# Patient Record
Sex: Female | Born: 1960 | Race: White | Hispanic: No | Marital: Married | Smoking: Former smoker
Health system: Southern US, Community
[De-identification: ages and names within clinical notes are randomized; demographics above are authoritative.]

## PROBLEM LIST (undated history)

## (undated) DIAGNOSIS — Z9981 Dependence on supplemental oxygen: Secondary | ICD-10-CM

## (undated) DIAGNOSIS — I509 Heart failure, unspecified: Secondary | ICD-10-CM

## (undated) DIAGNOSIS — E785 Hyperlipidemia, unspecified: Secondary | ICD-10-CM

## (undated) DIAGNOSIS — F32A Depression, unspecified: Secondary | ICD-10-CM

## (undated) DIAGNOSIS — I700 Atherosclerosis of aorta: Secondary | ICD-10-CM

## (undated) DIAGNOSIS — N200 Calculus of kidney: Secondary | ICD-10-CM

## (undated) DIAGNOSIS — I10 Essential (primary) hypertension: Secondary | ICD-10-CM

## (undated) DIAGNOSIS — J189 Pneumonia, unspecified organism: Secondary | ICD-10-CM

## (undated) DIAGNOSIS — J449 Chronic obstructive pulmonary disease, unspecified: Secondary | ICD-10-CM

## (undated) DIAGNOSIS — Z87442 Personal history of urinary calculi: Secondary | ICD-10-CM

## (undated) DIAGNOSIS — E119 Type 2 diabetes mellitus without complications: Secondary | ICD-10-CM

## (undated) HISTORY — DX: Calculus of kidney: N20.0

## (undated) HISTORY — DX: Heart failure, unspecified: I50.9

## (undated) HISTORY — DX: Chronic obstructive pulmonary disease, unspecified: J44.9

## (undated) HISTORY — DX: Hyperlipidemia, unspecified: E78.5

## (undated) HISTORY — DX: Atherosclerosis of aorta: I70.0

## (undated) HISTORY — PX: CARDIAC CATHETERIZATION: SHX172

## (undated) HISTORY — DX: Essential (primary) hypertension: I10

## (undated) HISTORY — PX: CYSTOSCOPY W/ URETERAL STENT PLACEMENT: SHX1429

## (undated) HISTORY — DX: Dependence on supplemental oxygen: Z99.81

---

## 2005-01-03 HISTORY — PX: OTHER SURGICAL HISTORY: SHX169

## 2015-01-04 DIAGNOSIS — T8859XA Other complications of anesthesia, initial encounter: Secondary | ICD-10-CM

## 2015-01-04 HISTORY — DX: Other complications of anesthesia, initial encounter: T88.59XA

## 2021-02-23 ENCOUNTER — Other Ambulatory Visit: Payer: Self-pay | Admitting: Family Medicine

## 2021-02-23 DIAGNOSIS — F17200 Nicotine dependence, unspecified, uncomplicated: Secondary | ICD-10-CM

## 2021-03-09 ENCOUNTER — Emergency Department (HOSPITAL_COMMUNITY): Payer: BC Managed Care – PPO

## 2021-03-09 ENCOUNTER — Encounter (HOSPITAL_COMMUNITY): Payer: Self-pay | Admitting: Emergency Medicine

## 2021-03-09 ENCOUNTER — Emergency Department (HOSPITAL_COMMUNITY)
Admission: EM | Admit: 2021-03-09 | Discharge: 2021-03-09 | Disposition: A | Discharge status: Home / Self Care | Payer: BC Managed Care – PPO | Attending: Emergency Medicine | Admitting: Emergency Medicine

## 2021-03-09 ENCOUNTER — Other Ambulatory Visit: Payer: Self-pay

## 2021-03-09 DIAGNOSIS — R109 Unspecified abdominal pain: Secondary | ICD-10-CM | POA: Diagnosis present

## 2021-03-09 DIAGNOSIS — N201 Calculus of ureter: Secondary | ICD-10-CM | POA: Diagnosis not present

## 2021-03-09 DIAGNOSIS — N9489 Other specified conditions associated with female genital organs and menstrual cycle: Secondary | ICD-10-CM | POA: Insufficient documentation

## 2021-03-09 DIAGNOSIS — N23 Unspecified renal colic: Secondary | ICD-10-CM | POA: Diagnosis not present

## 2021-03-09 LAB — CBC WITH DIFFERENTIAL/PLATELET
Abs Immature Granulocytes: 0.03 10*3/uL (ref 0.00–0.07)
Basophils Absolute: 0.1 10*3/uL (ref 0.0–0.1)
Basophils Relative: 1 %
Eosinophils Absolute: 0.3 10*3/uL (ref 0.0–0.5)
Eosinophils Relative: 2 %
HCT: 35.8 % — ABNORMAL LOW (ref 36.0–46.0)
Hemoglobin: 11.8 g/dL — ABNORMAL LOW (ref 12.0–15.0)
Immature Granulocytes: 0 %
Lymphocytes Relative: 20 %
Lymphs Abs: 2.4 10*3/uL (ref 0.7–4.0)
MCH: 26.9 pg (ref 26.0–34.0)
MCHC: 33 g/dL (ref 30.0–36.0)
MCV: 81.7 fL (ref 80.0–100.0)
Monocytes Absolute: 0.8 10*3/uL (ref 0.1–1.0)
Monocytes Relative: 7 %
Neutro Abs: 8.4 10*3/uL — ABNORMAL HIGH (ref 1.7–7.7)
Neutrophils Relative %: 70 %
Platelets: 308 10*3/uL (ref 150–400)
RBC: 4.38 MIL/uL (ref 3.87–5.11)
RDW: 13.6 % (ref 11.5–15.5)
WBC: 12 10*3/uL — ABNORMAL HIGH (ref 4.0–10.5)
nRBC: 0 % (ref 0.0–0.2)

## 2021-03-09 LAB — BASIC METABOLIC PANEL
Anion gap: 13 (ref 5–15)
BUN: 15 mg/dL (ref 6–20)
CO2: 25 mmol/L (ref 22–32)
Calcium: 9.4 mg/dL (ref 8.9–10.3)
Chloride: 101 mmol/L (ref 98–111)
Creatinine, Ser: 1.24 mg/dL — ABNORMAL HIGH (ref 0.44–1.00)
GFR, Estimated: 50 mL/min — ABNORMAL LOW (ref >60)
Glucose, Bld: 122 mg/dL — ABNORMAL HIGH (ref 70–99)
Potassium: 3.5 mmol/L (ref 3.5–5.1)
Sodium: 139 mmol/L (ref 135–145)

## 2021-03-09 LAB — URINALYSIS, ROUTINE W REFLEX MICROSCOPIC
Bilirubin Urine: NEGATIVE
Glucose, UA: NEGATIVE mg/dL
Hgb urine dipstick: NEGATIVE
Ketones, ur: NEGATIVE mg/dL
Nitrite: NEGATIVE
Protein, ur: NEGATIVE mg/dL
Specific Gravity, Urine: 1.013 (ref 1.005–1.030)
pH: 7 (ref 5.0–8.0)

## 2021-03-09 LAB — I-STAT BETA HCG BLOOD, ED (MC, WL, AP ONLY): I-stat hCG, quantitative: 10.5 m[IU]/mL — ABNORMAL HIGH (ref <5)

## 2021-03-09 MED ORDER — HYDROCODONE-ACETAMINOPHEN 5-325 MG PO TABS: 1.0000 | ORAL_TABLET | Freq: Four times a day (QID) | ORAL | 0 refills | Status: DC | PRN

## 2021-03-09 MED ORDER — KETOROLAC TROMETHAMINE 30 MG/ML IJ SOLN
30.0000 mg | Freq: Once | INTRAMUSCULAR | Status: AC
  Administered 2021-03-09: 30 mg via INTRAVENOUS
  Filled 2021-03-09: qty 1

## 2021-03-09 MED ORDER — TAMSULOSIN HCL 0.4 MG PO CAPS: 0.4000 mg | ORAL_CAPSULE | Freq: Two times a day (BID) | ORAL | 0 refills | Status: DC

## 2021-03-09 MED ORDER — ONDANSETRON 4 MG PO TBDP
4.0000 mg | ORAL_TABLET | Freq: Once | ORAL | Status: AC
  Administered 2021-03-09: 4 mg via ORAL
  Filled 2021-03-09: qty 1

## 2021-03-09 MED ORDER — OXYCODONE-ACETAMINOPHEN 5-325 MG PO TABS
1.0000 | ORAL_TABLET | Freq: Once | ORAL | Status: AC
  Administered 2021-03-09: 1 via ORAL
  Filled 2021-03-09: qty 1

## 2021-03-09 MED ORDER — ONDANSETRON HCL 4 MG/2ML IJ SOLN
4.0000 mg | Freq: Once | INTRAMUSCULAR | Status: AC
  Administered 2021-03-09: 4 mg via INTRAVENOUS
  Filled 2021-03-09: qty 2

## 2021-03-09 MED ORDER — ONDANSETRON HCL 4 MG PO TABS: 4.0000 mg | ORAL_TABLET | Freq: Three times a day (TID) | ORAL | 0 refills | Status: DC | PRN

## 2021-03-09 NOTE — ED Triage Notes (Signed)
Patient reports left flank pain onset 10 pm last night , no hematuria , denies emesis or fever .  ?

## 2021-03-09 NOTE — Discharge Instructions (Signed)
Go To [facility] today at 12:30pm ?Return to the ED immediately if you develop fever, uncontrolled pain or vomiting, or other concerns. ? ?

## 2021-03-09 NOTE — ED Provider Notes (Signed)
[facility] EMERGENCY DEPARTMENT Provider Note   CSN: [id number] Arrival date & time: 03/09/21  0425     History  Chief Complaint  Patient presents with   Flank Pain    Hx; Kidney Stones    Mckenzie Sullivan is a 61 y/o F with a hx of kidney stones s/p lithotripsy and stent placement who presents to the ED for L flank pain radiating into the LLQ since 2230 yesterday evening. Pt reports she's had chronic back pain for years but recognized last night that her back pain felt different, stating it felt more similar to her waxing/waning sharp, stabbing-like pain that she's had with previous kidney stones. She recalls her last stone was ~2 years ago. She has felt nauseated, but no vomiting. No diarrhea, fever, chills, dysuria, hematuria, or urinary frequency/urgency. Pt states she had a large stone in 2017 that caused severe hydronephrosis requiring both stent placement and lithotripsy. She reports having chronic and unchanged SOB since this hospital stay requiring 2L home oxygen and has followed up with pulmonology and cardiology. Of note, pt recently moved here ~9-10 months ago from [place] and is still working on establishing care. She has established care with Lance Bosch, ANP. No other medical concerns reported.    Flank Pain      Home Medications Prior to Admission medications   Medication Sig Start Date End Date Taking? Authorizing Provider  HYDROcodone-acetaminophen (NORCO) 5-325 MG tablet Take 1-2 tablets by mouth every 6 (six) hours as needed. 03/09/21  Yes Nakai Pollio, PA-C  ondansetron (ZOFRAN) 4 MG tablet Take 1 tablet (4 mg total) by mouth every 8 (eight) hours as needed for nausea or vomiting. 03/09/21  Yes Arthor Captain, PA-C  tamsulosin (FLOMAX) 0.4 MG CAPS capsule Take 1 capsule (0.4 mg total) by mouth 2 (two) times daily. 03/09/21  Yes Arthor Captain, PA-C      Allergies    Patient has no known allergies.    Review of Systems   Review of Systems   Genitourinary:  Positive for flank pain.   Physical Exam Updated Vital Signs BP (!) 159/72    Pulse 62    Temp 97.9 F (36.6 C) (Oral)    Resp 20    SpO2 99%  Physical Exam Vitals and nursing note reviewed.  Constitutional:      General: She is not in acute distress.    Appearance: She is well-developed. She is not diaphoretic.  HENT:     Head: Normocephalic and atraumatic.     Right Ear: External ear normal.     Left Ear: External ear normal.     Nose: Nose normal.     Mouth/Throat:     Mouth: Mucous membranes are moist.  Eyes:     General: No scleral icterus.    Conjunctiva/sclera: Conjunctivae normal.  Cardiovascular:     Rate and Rhythm: Normal rate and regular rhythm.     Heart sounds: Normal heart sounds. No murmur heard.   No friction rub. No gallop.  Pulmonary:     Effort: Pulmonary effort is normal. No respiratory distress.     Breath sounds: Normal breath sounds.  Abdominal:     General: Bowel sounds are normal. There is no distension.     Palpations: Abdomen is soft. There is no mass.     Tenderness: There is no abdominal tenderness. There is no guarding.  Musculoskeletal:     Cervical back: Normal range of motion.  Skin:    General: Skin  is warm and dry.  Neurological:     Mental Status: She is alert and oriented to person, place, and time.  Psychiatric:        Behavior: Behavior normal.    ED Results / Procedures / Treatments   Labs (all labs ordered are listed, but only abnormal results are displayed) Labs Reviewed  BASIC METABOLIC PANEL - Abnormal; Notable for the following components:      Result Value   Glucose, Bld 122 (*)    Creatinine, Ser 1.24 (*)    GFR, Estimated 50 (*)    All other components within normal limits  CBC WITH DIFFERENTIAL/PLATELET - Abnormal; Notable for the following components:   WBC 12.0 (*)    Hemoglobin 11.8 (*)    HCT 35.8 (*)    Neutro Abs 8.4 (*)    All other components within normal limits  URINALYSIS, ROUTINE  W REFLEX MICROSCOPIC - Abnormal; Notable for the following components:   APPearance CLOUDY (*)    Leukocytes,Ua TRACE (*)    Bacteria, UA RARE (*)    All other components within normal limits  I-STAT BETA HCG BLOOD, ED ([facility], [facility], [facility] ONLY) - Abnormal; Notable for the following components:   I-stat hCG, quantitative 10.5 (*)    All other components within normal limits    EKG None  Radiology CT Renal Stone Study  Result Date: 03/09/2021 CLINICAL DATA:  61 year old female with history of flank pain. Suspected kidney stone. EXAM: CT ABDOMEN AND PELVIS WITHOUT CONTRAST TECHNIQUE: Multidetector CT imaging of the abdomen and pelvis was performed following the standard protocol without IV contrast. RADIATION DOSE REDUCTION: This exam was performed according to the departmental dose-optimization program which includes automated exposure control, adjustment of the mA and/or kV according to patient size and/or use of iterative reconstruction technique. COMPARISON:  No priors. FINDINGS: Lower chest: Bilateral breast implants are incidentally noted. Hepatobiliary: No definite suspicious cystic or solid hepatic lesions are confidently identified on today's noncontrast CT examination. Unenhanced appearance of the gallbladder is normal. Pancreas: No definite pancreatic mass or peripancreatic fluid collections or inflammatory changes are noted on today's noncontrast CT examination. Spleen: Unremarkable. Adrenals/Urinary Tract: In the proximal third of the left ureter there is an obstructive calculus measuring 5 x 6 x 7 mm, with severe proximal left hydroureteronephrosis and extensive left-sided perinephric stranding. No additional calculi are identified elsewhere along the course of either ureter, within the right renal collecting system, or in the lumen of the urinary bladder. No right hydroureteronephrosis. Unenhanced appearance of the right kidney and bilateral adrenal glands is otherwise normal. Unenhanced  appearance of the urinary bladder is normal. Stomach/Bowel: Unenhanced appearance of the stomach is normal. No pathologic dilatation of small bowel or colon. Numerous colonic diverticulae are noted in the sigmoid colon, without surrounding inflammatory changes to suggest an acute diverticulitis at this time. The appendix is not confidently identified and may be surgically absent. Regardless, there are no inflammatory changes noted adjacent to the cecum to suggest the presence of an acute appendicitis at this time. Vascular/Lymphatic: Aortic atherosclerosis. No lymphadenopathy noted in the abdomen or pelvis. Reproductive: Uterus and ovaries are unremarkable in appearance. Other: No significant volume of ascites.  No pneumoperitoneum. Musculoskeletal: There are no aggressive appearing lytic or blastic lesions noted in the visualized portions of the skeleton. IMPRESSION: 1. Obstructive 5 x 6 x 7 mm calculus in the proximal third of the left ureter with severe proximal left hydroureteronephrosis. 2. Aortic atherosclerosis. Electronically Signed   By: Reuel Boom  Entrikin M.D.   On: 03/09/2021 05:31    Procedures Procedures    Medications Ordered in ED Medications  ondansetron (ZOFRAN-ODT) disintegrating tablet 4 mg (4 mg Oral Given 03/09/21 0442)  oxyCODONE-acetaminophen (PERCOCET/ROXICET) 5-325 MG per tablet 1 tablet (1 tablet Oral Given 03/09/21 0441)  ketorolac (TORADOL) 30 MG/ML injection 30 mg (30 mg Intravenous Given 03/09/21 1002)  ondansetron (ZOFRAN) injection 4 mg (4 mg Intravenous Given 03/09/21 1001)    ED Course/ Medical Decision Making/ A&P Clinical Course as of 03/09/21 1046  Tue Mar 09, 2021  1028 I-stat hCG, quantitative(!): 10.5 False positive [AH]  1028 Basic metabolic panel(!) [AH]  1028 Creatinine(!): 1.24 Mildly elevated cr. No baseline [AH]  1028 CBC with Differential(!) [AH]  1028 WBC(!): 12.0 WBC up- likely APR [AH]  1029 Urinalysis, Routine w reflex microscopic(!) UA negative for  infection [AH]  1031 CT Renal Stone Study I personally visualized images of CT which shows UPJ STOne with marked hydro/ureteronephrosis  [AH]  1034 Case discussed with Dr.Bell- patient will see the patient in clinic at 12:45 AM this morning.  I called the office and secured the appointment for the patient.  Patient pain is significantly improved after Toradol and she feels comfortable with this plan. [AH]    Clinical Course User Index [AH] Arthor Captain, PA-C                           Medical Decision Making Patient here with flank pain. The differential diagnosis of emergent flank pain includes, but is not limited to :Abdominal aortic aneurysm,, Renal artery embolism,Renal vein thrombosis, Aortic dissection, Mesenteric ischemia, Pyelonephritis, Renal infarction, Renal hemorrhage, Nephrolithiasis/ Renal Colic, Bladder tumor,Cystitis, Biliary colic, Pancreatitis Perforated peptic ulcer Appendicitis ,Inguinal Hernia, Diverticulitis, Bowel obstruction Ectopic Pregnancy,PID/TOA,Ovarian cyst, Ovarian torsion Shingles Lower lobe pneumonia, Retroperitoneal hematoma/abscess/tumor, Epidural abscess, Epidural hematoma. Patient has an extensive history of recurrent kidney stones.  She appears to have a large proximal stone that will likely require intervention.  I considered hospitalization however patient's pain is well controlled.  Her labs are not significantly abnormal.  She does not appear to have infected stone.  Case discussed with Dr. Alvester Morin who will see her in the office today at 1245.  Patient is comfortable with this plan.  I reviewed the PDMP and have prescribed narcotic pain medications, Flomax and antinausea medication.  The patient's pain is significantly improved after giving Toradol here in the emergency department.  She appears appropriate for discharge with extremely close outpatient follow-up today.    Problems Addressed: Ureteral colic: acute illness or injury with systemic  symptoms Ureterolithiasis: acute illness or injury with systemic symptoms  Risk Prescription drug management.    Final Clinical Impression(s) / ED Diagnoses Final diagnoses:  Ureterolithiasis  Ureteral colic    Rx / DC Orders ED Discharge Orders          Ordered    HYDROcodone-acetaminophen (NORCO) 5-325 MG tablet  Every 6 hours PRN        03/09/21 1025    ondansetron (ZOFRAN) 4 MG tablet  Every 8 hours PRN        03/09/21 1025    tamsulosin (FLOMAX) 0.4 MG CAPS capsule  2 times daily        03/09/21 1025              Arthor Captain, PA-C 03/09/21 1047    Jacalyn Lefevre, MD 03/09/21 1229

## 2021-03-09 NOTE — ED Provider Triage Note (Signed)
Emergency Medicine Provider Triage Evaluation Note ? ?Mckenzie Sullivan , a 61 y.o. female  was evaluated in triage.  Pt complains of gradual onset, constant, sharp, left flank pain with radiation into left lower quadrant that began around 10 PM last night.  Patient also complains of nausea.  She reports history of frequent kidney stones and states this feels similar.  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  She has not taken anything specifically for pain.  Denies any fevers or chills. ? ?Review of Systems  ?Positive: + flank pain, nausea ?Negative: - vomiting, fevers, urinary symptoms ? ?Physical Exam  ?BP (!) 158/90 (BP Location: Left Arm)   Pulse 72   Temp 98 ?F (36.7 ?C) (Oral)   Resp 17   SpO2 100%  ?Gen:   Awake, no distress   ?Resp:  Normal effort ?MSK:   Moves extremities without difficulty  ?Other:  + left CVA TTP ? ?Medical Decision Making  ?Medically screening exam initiated at 4:36 AM.  Appropriate orders placed.  Rya Rausch was informed that the remainder of the evaluation will be completed by another provider, this initial triage assessment does not replace that evaluation, and the importance of remaining in the ED until their evaluation is complete. ? ? ?  ?Tanda Rockers, PA-C ?03/09/21 6812 ? ?

## 2021-03-15 ENCOUNTER — Encounter: Payer: Self-pay | Admitting: Cardiology

## 2021-03-15 ENCOUNTER — Ambulatory Visit: Payer: BC Managed Care – PPO | Admitting: Cardiology

## 2021-03-15 ENCOUNTER — Ambulatory Visit: Payer: BC Managed Care – PPO

## 2021-03-15 ENCOUNTER — Other Ambulatory Visit: Payer: Self-pay

## 2021-03-15 VITALS — BP 168/77 | HR 84 | Temp 98.9°F | Resp 16 | Ht 62.0 in | Wt 194.0 lb

## 2021-03-15 DIAGNOSIS — Z01818 Encounter for other preprocedural examination: Secondary | ICD-10-CM

## 2021-03-15 DIAGNOSIS — E785 Hyperlipidemia, unspecified: Secondary | ICD-10-CM

## 2021-03-15 DIAGNOSIS — I700 Atherosclerosis of aorta: Secondary | ICD-10-CM

## 2021-03-15 DIAGNOSIS — Z9981 Dependence on supplemental oxygen: Secondary | ICD-10-CM

## 2021-03-15 DIAGNOSIS — R030 Elevated blood-pressure reading, without diagnosis of hypertension: Secondary | ICD-10-CM

## 2021-03-15 DIAGNOSIS — Z87891 Personal history of nicotine dependence: Secondary | ICD-10-CM

## 2021-03-15 DIAGNOSIS — J449 Chronic obstructive pulmonary disease, unspecified: Secondary | ICD-10-CM

## 2021-03-15 MED ORDER — AMLODIPINE BESYLATE 5 MG PO TABS: 5.0000 mg | ORAL_TABLET | Freq: Every morning | ORAL | 0 refills | Status: DC

## 2021-03-15 NOTE — Progress Notes (Signed)
Date:  03/15/2021   ID:  [name], DOB 1960/09/03, MRN [id number]  PCP:  Lance Bosch, NP  Cardiologist:  Tessa Lerner, DO, [facility] (established care 03/15/2021) Former Cardiology Providers: Karleen Hampshire, PA; Dr. Laverta Baltimore (EP).   REASON FOR CONSULT: Preoperative risk stratification  REQUESTING PHYSICIAN:  Lance Bosch, NP [address] [place],  [place] [address]  Chief Complaint  Patient presents with   Pre-op Exam   Congestive Heart Failure    HPI  Mckenzie Sullivan is a 61 y.o. Caucasian female who presents to the office with a chief complaint of " preop clearance." Patient's past medical history and cardiovascular risk factors include: Chronic HFpEF, dyslipidemia, diabetes mellitus type 2 (diet controlled), COPD requiring supplemental oxygen, former smoker, atherosclerosis of the abdominal aorta.   She is referred to the office at the request of Lance Bosch, NP for evaluation of preoperative risk stratification.  Patient has history of recurrent nephrolithiasis starting 2017.  Recently started having left-sided flank pain which is usually a sign of kidney stones and therefore went to the [facility], ED on March 09, 2021 and was diagnosed with nephrolithiasis along with hydronephrosis.  She has followed up with urology and plans are to undergo possible lithotripsy with ureteral stent placement.  The date of the procedure is still to be determined.  She is referred to cardiology for preoperative risk stratification for this upcoming noncardiac procedure - recommendations regarding  holding aspirin prior to the elective procedure.  Actively denies any chest pain at rest or with effort related activities.  Shortness of breath is chronic and stable.  At rest she requires 2 L of nasal cannula oxygen given her COPD and with exertion she requires 3 L.  She denies orthopnea, paroxysmal nocturnal dyspnea or lower extremity swelling.  Functional status is limited due to her  requiring nasal cannula oxygen.  But is still able to do her activities of daily living without any discomfort.  No significant obstructive CAD based on her last heart catheterization.  She carries a history of congestive heart failure.  Last hospitalization 2017.  Currently euvolemic. Not on GDMT for reason unknown.   Given her extensive pulmonary comorbidities patient is in the process of getting reestablished with a local pulmonologist.  She has a CT scan scheduled next week and pulmonary appointment the following week.  ALLERGIES: No Known Allergies  MEDICATION LIST PRIOR TO VISIT: Current Meds  Medication Sig   albuterol (VENTOLIN HFA) 108 (90 Base) MCG/ACT inhaler Inhale 1-2 puffs into the lungs.   amLODipine (NORVASC) 5 MG tablet Take 1 tablet (5 mg total) by mouth every morning.   atorvastatin (LIPITOR) 40 MG tablet Take 40 mg by mouth daily.   clobetasol ointment (TEMOVATE) 0.05 % Apply to hands and foots bid for 2 weeks then qd for 2 weeks then qod for 2 weeks Avoid face underarms and groin   ezetimibe (ZETIA) 10 MG tablet Take 10 mg by mouth daily.   fluticasone (FLONASE) 50 MCG/ACT nasal spray Place into the nose.   HYDROcodone-acetaminophen (NORCO) 5-325 MG tablet Take 1-2 tablets by mouth every 6 (six) hours as needed.   hydrOXYzine (ATARAX) 10 MG tablet Take 1-2 tablets by mouth at bedtime.   mupirocin ointment (BACTROBAN) 2 % SMARTSIG:1 Application Topical 2-3 Times Daily   ondansetron (ZOFRAN) 4 MG tablet Take 1 tablet (4 mg total) by mouth every 8 (eight) hours as needed for nausea or vomiting.   OTEZLA 30 MG TABS Take 1 tablet by  mouth 2 (two) times daily.   tamsulosin (FLOMAX) 0.4 MG CAPS capsule Take 1 capsule (0.4 mg total) by mouth 2 (two) times daily.   TRELEGY ELLIPTA 200-62.5-25 MCG/ACT AEPB Take 1 puff by mouth daily.   venlafaxine XR (EFFEXOR-XR) 150 MG 24 hr capsule Take 150 mg by mouth daily.     PAST MEDICAL HISTORY: Past Medical History:  Diagnosis Date    Abdominal aortic atherosclerosis (HCC)    CHF (congestive heart failure) (HCC)    COPD (chronic obstructive pulmonary disease) (HCC)    Dyslipidemia    Hypertension    Kidney stones    Oxygen dependent     PAST SURGICAL HISTORY: Past Surgical History:  Procedure Laterality Date   CESAREAN SECTION     TONSILLECTOMY      FAMILY HISTORY: The patient family history includes Pneumonia in her mother.  SOCIAL HISTORY:  The patient  reports that she quit smoking about 13 years ago. Her smoking use included cigarettes. She has never used smokeless tobacco. She reports that she does not currently use drugs after having used the following drugs: Marijuana.  REVIEW OF SYSTEMS: Review of Systems  Cardiovascular:  Positive for dyspnea on exertion (chronic and stable.). Negative for chest pain, claudication, leg swelling, near-syncope, orthopnea, palpitations, paroxysmal nocturnal dyspnea and syncope.  Respiratory:  Positive for shortness of breath (chronic and stable.).    PHYSICAL EXAM: Vitals with BMI 03/15/2021 03/15/2021 03/09/2021  Height - 5\' 2"  -  Weight - 194 lbs -  BMI - 35.47 -  Systolic 168 171  Diastolic 77 74 72  Pulse 84 87 62    CONSTITUTIONAL: Well-developed and well-nourished. No acute distress.  Nasal cannula oxygen. SKIN: Skin is warm and dry. No rash noted. No cyanosis. No pallor. No jaundice HEAD: Normocephalic and atraumatic.  EYES: No scleral icterus MOUTH/THROAT: Moist oral membranes.  NECK: No JVD present. No thyromegaly noted. No carotid bruits  LYMPHATIC: No visible cervical adenopathy.  CHEST Normal respiratory effort. No intercostal retractions  LUNGS: Clear to auscultation bilaterally upper lung fields with decreased breath sounds at the bases.  No stridor. No wheezes. No rales.  CARDIOVASCULAR: Regular rate and rhythm, positive S1-S2, no murmurs rubs or gallops appreciated. ABDOMINAL: Soft, nontender, nondistended, positive bowel sounds in all 4  quadrants no apparent ascites.  EXTREMITIES: No peripheral edema, warm to touch, 2+ bilateral PT pulses. HEMATOLOGIC: No significant bruising NEUROLOGIC: Oriented to person, place, and time. Nonfocal. Normal muscle tone.  PSYCHIATRIC: Normal mood and affect. Normal behavior. Cooperative  CARDIAC DATABASE: EKG: 03/15/2021: NSR, 74 bpm, normal axis, negative precordial T waves.   Echocardiogram: 04/03/2019 (@ [facility]): LVEF 60%. No significant valve disease.  Stress Testing: No results found for this or any previous visit from the past 1095 days.   Heart Catheterization: Cardiac catheterization 10/06/2015 (@ [facility]); RA 16, RV 48/10, PA 48/21, PAOP 20, CO 4.86, CI 5.4, LVEDP 23 mmHg. LVEF 60%. Mild nonobstructive CAD; 30% narrowing of the origin of the first diagonal branch.   LABORATORY DATA: CBC Latest Ref Rng & Units 03/09/2021  WBC 4.0 - 10.5 K/uL 12.0(H)  Hemoglobin 12.0 - 15.0 g/dL 11.8(L)  Hematocrit 36.0 - 46.0 % 35.8(L)  Platelets 150 - 400 K/uL 308    CMP Latest Ref Rng & Units 03/09/2021  Glucose 70 - 99 mg/dL 05/09/2021)  BUN 6 - 20 mg/dL 15  Creatinine 297(L - 8.92 mg/dL 1.19)  Sodium 4.17(E - 081 mmol/L 139  Potassium 3.5 -  5.1 mmol/L 3.5  Chloride 98 - 111 mmol/L 101  CO2 22 - 32 mmol/L 25  Calcium 8.9 - 10.3 mg/dL 9.4    Lipid Panel  No results found for: CHOL, TRIG, HDL, CHOLHDL, VLDL, LDLCALC, LDLDIRECT, LABVLDL  No components found for: NTPROBNP No results for input(s): PROBNP in the last 8760 hours. No results for input(s): TSH in the last 8760 hours.  BMP Recent Labs    03/09/21 0447  NA 139  K 3.5  CL 101  CO2 25  GLUCOSE 122*  BUN 15  CREATININE 1.24*  CALCIUM 9.4  GFRNONAA 50*    HEMOGLOBIN A1C No results found for: HGBA1C, MPG  IMPRESSION:    ICD-10-CM   1. Pre-op evaluation  Z01.818 EKG 12-Lead    PCV ECHOCARDIOGRAM COMPLETE    2. Dyslipidemia  E78.5     3. Atherosclerosis of aorta (HCC)  I70.0     4.  Former smoker  Z87.891     5. Chronic obstructive pulmonary disease, unspecified COPD type (HCC)  J44.9     6. Oxygen dependent  Z99.81     7. Blood pressure elevated without history of HTN  R03.0 amLODipine (NORVASC) 5 MG tablet       RECOMMENDATIONS: Edlin Ford is a 61 y.o. Caucasian female whose past medical history and cardiac risk factors include: Chronic HFpEF, dyslipidemia, diabetes mellitus type 2 (diet controlled), COPD requiring supplemental oxygen, former smoker, atherosclerosis of the abdominal aorta.   Pre-op evaluation Known history of recurrent nephrolithiasis recently diagnosed with kidney stones associated with hydronephrosis. Referred to cardiology for preprocedural risk stratification and recommendations regarding holding aspirin. Patient is only held aspirin prior to establishing care. Denies any anginal discomfort or heart failure symptoms. Overall functional status is limited due to oxygen dependent COPD; however, last left heart catheterization did not report any significant obstructive CAD. EKG: Normal sinus rhythm without underlying ischemia injury pattern Echo will be ordered to evaluate for structural heart disease and left ventricular systolic function. Recommend proceeding with the upcoming noncardiac procedure as long as the LVEF is preserved and no significant valvular heart disease. Office blood pressures are not well controlled.  No prior history of benign essential hypertension. Recommended keeping a log of her blood pressures and can either review with PCP or myself at the next visit. Given her upcoming surgery we will start her on amlodipine 5 mg p.o. daily.  Have not started ACE inhibitors/ARB/diuretics to prevent AKI in the setting of nephrolithiasis/hydronephrosis.  Dyslipidemia Currently on atorvastatin.   She denies myalgia or other side effects. She will have fasting lipid profile rechecked when establishing care with PCP in the coming  weeks.  Atherosclerosis of aorta (HCC) Continue aspirin and statin therapy. Patient has held aspirin given her upcoming noncardiac procedure.  Chronic obstructive pulmonary disease, unspecified COPD type (HCC) / Oxygen dependent Currently on 2 L nasal cannula oxygen at rest.  3 L with exertion. Plan to have a CT scan in the coming weeks with pulmonary follow-up.  Blood pressure elevated without history of HTN We will start amlodipine 5 mg p.o. daily for reasons mentioned above.    FINAL MEDICATION LIST END OF ENCOUNTER: Meds ordered this encounter  Medications   amLODipine (NORVASC) 5 MG tablet    Sig: Take 1 tablet (5 mg total) by mouth every morning.    Dispense:  90 tablet    Refill:  0    There are no discontinued medications.   Current Outpatient Medications:  albuterol (VENTOLIN HFA) 108 (90 Base) MCG/ACT inhaler, Inhale 1-2 puffs into the lungs., Disp: , Rfl:    amLODipine (NORVASC) 5 MG tablet, Take 1 tablet (5 mg total) by mouth every morning., Disp: 90 tablet, Rfl: 0   atorvastatin (LIPITOR) 40 MG tablet, Take 40 mg by mouth daily., Disp: , Rfl:    clobetasol ointment (TEMOVATE) 0.05 %, Apply to hands and foots bid for 2 weeks then qd for 2 weeks then qod for 2 weeks Avoid face underarms and groin, Disp: , Rfl:    ezetimibe (ZETIA) 10 MG tablet, Take 10 mg by mouth daily., Disp: , Rfl:    fluticasone (FLONASE) 50 MCG/ACT nasal spray, Place into the nose., Disp: , Rfl:    HYDROcodone-acetaminophen (NORCO) 5-325 MG tablet, Take 1-2 tablets by mouth every 6 (six) hours as needed., Disp: 16 tablet, Rfl: 0   hydrOXYzine (ATARAX) 10 MG tablet, Take 1-2 tablets by mouth at bedtime., Disp: , Rfl:    mupirocin ointment (BACTROBAN) 2 %, SMARTSIG:1 Application Topical 2-3 Times Daily, Disp: , Rfl:    ondansetron (ZOFRAN) 4 MG tablet, Take 1 tablet (4 mg total) by mouth every 8 (eight) hours as needed for nausea or vomiting., Disp: 10 tablet, Rfl: 0   OTEZLA 30 MG TABS, Take 1  tablet by mouth 2 (two) times daily., Disp: , Rfl:    tamsulosin (FLOMAX) 0.4 MG CAPS capsule, Take 1 capsule (0.4 mg total) by mouth 2 (two) times daily., Disp: 10 capsule, Rfl: 0   TRELEGY ELLIPTA 200-62.5-25 MCG/ACT AEPB, Take 1 puff by mouth daily., Disp: , Rfl:    venlafaxine XR (EFFEXOR-XR) 150 MG 24 hr capsule, Take 150 mg by mouth daily., Disp: , Rfl:   Orders Placed This Encounter  Procedures   EKG 12-Lead   PCV ECHOCARDIOGRAM COMPLETE    There are no Patient Instructions on file for this visit.   --Continue cardiac medications as reconciled in final medication list. --Return in about 6 weeks (around 04/26/2021) for Follow up ? CHF and post-op procedure. Or sooner if needed. --Continue follow-up with your primary care physician regarding the management of your other chronic comorbid conditions.  Patient's questions and concerns were addressed to her satisfaction. She voices understanding of the instructions provided during this encounter.   This note was created using a voice recognition software as a result there may be grammatical errors inadvertently enclosed that do not reflect the nature of this encounter. Every attempt is made to correct such errors.  Tessa LernerSunit Shaka Zech, [place]DO, Carlinville Area HospitalFACC  Pager: [phone] Office: [phone]

## 2021-03-16 ENCOUNTER — Encounter: Payer: Self-pay | Admitting: Cardiology

## 2021-03-16 ENCOUNTER — Institutional Professional Consult (permissible substitution): Payer: Self-pay | Admitting: Pulmonary Disease

## 2021-03-16 NOTE — Telephone Encounter (Signed)
From pt

## 2021-03-17 ENCOUNTER — Other Ambulatory Visit: Payer: Self-pay | Admitting: Urology

## 2021-03-17 ENCOUNTER — Encounter: Payer: Self-pay | Admitting: Cardiology

## 2021-03-17 NOTE — Anesthesia Preprocedure Evaluation (Addendum)
Anesthesia Evaluation  ?Patient identified by MRN, date of birth, ID band ?Patient awake ? ? ? ?Reviewed: ?Allergy & Precautions, NPO status , Patient's Chart, lab work & pertinent test results ? ?Airway ?Mallampati: II ? ?TM Distance: >3 FB ?Neck ROM: Full ? ? ? Dental ?no notable dental hx. ?(+) Partial Upper, Dental Advisory Given ?  ?Pulmonary ?COPD,  oxygen dependent, former smoker,  ?2L at Rest and 3L w exertion ?  ?Pulmonary exam normal ?breath sounds clear to auscultation ? ? ? ? ? ? Cardiovascular ?hypertension, Pt. on medications ?+CHF  ?Normal cardiovascular exam ?Rhythm:Regular Rate:Normal ? ? ?  ?Neuro/Psych ?negative neurological ROS ? negative psych ROS  ? GI/Hepatic ?negative GI ROS, Neg liver ROS,   ?Endo/Other  ?diabetesDiet Controlled ? Renal/GU ?Renal diseasenephrolithiasis  ? ?  ?Musculoskeletal ?negative musculoskeletal ROS ?(+)  ? Abdominal ?(+) + obese (BMI 35.48),   ?Peds ? Hematology ?negative hematology ROS ?(+)   ?Anesthesia Other Findings ?ALL: PCN ? Reproductive/Obstetrics ? ?  ? ? ? ? ? ? ? ? ? ? ? ? ? ?  ?  ? ? ? ? ? ? ? ?Anesthesia Physical ?Anesthesia Plan ? ?ASA: 3 ? ?Anesthesia Plan: MAC  ? ?Post-op Pain Management:   ? ?Induction:  ? ?PONV Risk Score and Plan: Treatment may vary due to age or medical condition, Midazolam and Ondansetron ? ?Airway Management Planned: Natural Airway and Nasal Cannula ? ?Additional Equipment: None ? ?Intra-op Plan:  ? ?Post-operative Plan:  ? ?Informed Consent: I have reviewed the patients History and Physical, chart, labs and discussed the procedure including the risks, benefits and alternatives for the proposed anesthesia with the patient or authorized representative who has indicated his/her understanding and acceptance.  ? ? ? ?Dental advisory given ? ?Plan Discussed with: CRNA and Anesthesiologist ? ?Anesthesia Plan Comments:   ? ? ? ? ? ?Anesthesia Quick Evaluation ? ?

## 2021-03-17 NOTE — Progress Notes (Signed)
Talked with patient. Hx and meds reviewed. Instructions given.  Arrival time 1030, clear liquids until 0830, solids until 0630. Driver is secured. Clearance on the chart                                    ?

## 2021-03-18 ENCOUNTER — Encounter (HOSPITAL_BASED_OUTPATIENT_CLINIC_OR_DEPARTMENT_OTHER)
Admission: RE | Disposition: A | Discharge status: Home / Self Care | Payer: Self-pay | Source: Home / Self Care | Attending: Urology

## 2021-03-18 ENCOUNTER — Ambulatory Visit (HOSPITAL_BASED_OUTPATIENT_CLINIC_OR_DEPARTMENT_OTHER)
Admission: RE | Admit: 2021-03-18 | Discharge: 2021-03-18 | Disposition: A | Discharge status: Home / Self Care | Payer: BC Managed Care – PPO | Attending: Urology | Admitting: Urology

## 2021-03-18 ENCOUNTER — Inpatient Hospital Stay: Admission: RE | Admit: 2021-03-18 | Payer: BC Managed Care – PPO | Source: Ambulatory Visit

## 2021-03-18 ENCOUNTER — Ambulatory Visit (HOSPITAL_COMMUNITY): Payer: BC Managed Care – PPO

## 2021-03-18 ENCOUNTER — Ambulatory Visit (HOSPITAL_BASED_OUTPATIENT_CLINIC_OR_DEPARTMENT_OTHER): Payer: BC Managed Care – PPO | Admitting: Certified Registered Nurse Anesthetist

## 2021-03-18 ENCOUNTER — Encounter (HOSPITAL_BASED_OUTPATIENT_CLINIC_OR_DEPARTMENT_OTHER): Payer: Self-pay | Admitting: Urology

## 2021-03-18 ENCOUNTER — Other Ambulatory Visit: Payer: Self-pay

## 2021-03-18 DIAGNOSIS — I509 Heart failure, unspecified: Secondary | ICD-10-CM | POA: Insufficient documentation

## 2021-03-18 DIAGNOSIS — I110 Hypertensive heart disease with heart failure: Secondary | ICD-10-CM | POA: Diagnosis not present

## 2021-03-18 DIAGNOSIS — Z87442 Personal history of urinary calculi: Secondary | ICD-10-CM | POA: Diagnosis not present

## 2021-03-18 DIAGNOSIS — N201 Calculus of ureter: Secondary | ICD-10-CM | POA: Insufficient documentation

## 2021-03-18 HISTORY — PX: EXTRACORPOREAL SHOCK WAVE LITHOTRIPSY: SHX1557

## 2021-03-18 LAB — GLUCOSE, CAPILLARY: Glucose-Capillary: 109 mg/dL — ABNORMAL HIGH (ref 70–99)

## 2021-03-18 SURGERY — LITHOTRIPSY, ESWL
Anesthesia: Monitor Anesthesia Care | Laterality: Left

## 2021-03-18 MED ORDER — MIDAZOLAM HCL 2 MG/2ML IJ SOLN
INTRAMUSCULAR | Status: AC
  Filled 2021-03-18: qty 2

## 2021-03-18 MED ORDER — HYDROCODONE-ACETAMINOPHEN 5-325 MG PO TABS: 1.0000 | ORAL_TABLET | Freq: Four times a day (QID) | ORAL | 0 refills | Status: DC | PRN

## 2021-03-18 MED ORDER — CIPROFLOXACIN HCL 500 MG PO TABS
500.0000 mg | ORAL_TABLET | ORAL | Status: AC
  Administered 2021-03-18: 500 mg via ORAL

## 2021-03-18 MED ORDER — DIPHENHYDRAMINE HCL 25 MG PO CAPS
ORAL_CAPSULE | ORAL | Status: AC
Start: 2021-03-18 — End: ?
  Filled 2021-03-18: qty 1

## 2021-03-18 MED ORDER — SODIUM CHLORIDE 0.9 % IV SOLN: INTRAVENOUS | Status: DC

## 2021-03-18 MED ORDER — CIPROFLOXACIN HCL 500 MG PO TABS
ORAL_TABLET | ORAL | Status: AC
  Filled 2021-03-18: qty 1

## 2021-03-18 MED ORDER — FENTANYL CITRATE (PF) 250 MCG/5ML IJ SOLN
INTRAMUSCULAR | Status: DC | PRN
Start: 2021-03-18 — End: 2021-03-18
  Administered 2021-03-18 (×2): 25 ug via INTRAVENOUS
  Administered 2021-03-18: 50 ug via INTRAVENOUS

## 2021-03-18 MED ORDER — FENTANYL CITRATE (PF) 100 MCG/2ML IJ SOLN
INTRAMUSCULAR | Status: AC
  Filled 2021-03-18: qty 2

## 2021-03-18 MED ORDER — DIPHENHYDRAMINE HCL 25 MG PO CAPS
25.0000 mg | ORAL_CAPSULE | ORAL | Status: AC
  Administered 2021-03-18: 25 mg via ORAL

## 2021-03-18 MED ORDER — DIAZEPAM 5 MG PO TABS
10.0000 mg | ORAL_TABLET | ORAL | Status: AC
  Administered 2021-03-18: 10 mg via ORAL
  Filled 2021-03-18: qty 2

## 2021-03-18 MED ORDER — MIDAZOLAM HCL 5 MG/5ML IJ SOLN
INTRAMUSCULAR | Status: DC | PRN
  Administered 2021-03-18: 2 mg via INTRAVENOUS

## 2021-03-18 MED ORDER — TAMSULOSIN HCL 0.4 MG PO CAPS
0.4000 mg | ORAL_CAPSULE | Freq: Every day | ORAL | 0 refills | Status: DC
Start: 2021-03-18 — End: 2021-05-24

## 2021-03-18 NOTE — Discharge Instructions (Signed)
1. You should strain your urine and collect all fragments and bring them to your follow up appointment.  °2. You should take your pain medication as needed.  Please call if your pain is severe to the point that it is not controlled with your pain medication. °3. You should call if you develop fever > 101 or persistent nausea or vomiting. °4. Your doctor may prescribe tamsulosin to take to help facilitate stone passage. °

## 2021-03-18 NOTE — Op Note (Signed)
See [employer] operative note scanned into chart. Also because of the size, density, location and other factors that cannot be anticipated I feel this will likely be a staged procedure. This fact supersedes any indication in the scanned [place] stone operative note to the contrary. ? ?Donald Pore MD ?03/18/2021, 2:28 PM  ?[facility]  ?Pager: [phone] ? ?

## 2021-03-18 NOTE — Anesthesia Postprocedure Evaluation (Signed)
Anesthesia Post Note ? ?Patient: Mckenzie Sullivan ? ?Procedure(s) Performed: LEFT EXTRACORPOREAL SHOCK WAVE LITHOTRIPSY (ESWL) (Left) ? ?  ? ?Patient location during evaluation: PACU ?Anesthesia Type: MAC ?Level of consciousness: awake and alert ?Pain management: pain level controlled ?Vital Signs Assessment: post-procedure vital signs reviewed and stable ?Respiratory status: spontaneous breathing, nonlabored ventilation, respiratory function stable and patient connected to nasal cannula oxygen ?Cardiovascular status: stable and blood pressure returned to baseline ?Postop Assessment: no apparent nausea or vomiting ?Anesthetic complications: no ? ? ?No notable events documented. ? ?Last Vitals:  ?Vitals:  ? 03/18/21 1131 03/18/21 1346  ?BP: (!) 158/68 (!) 149/70  ?Pulse: 78 72  ?Resp: 18 14  ?Temp: 36.4 ?C 36.4 ?C  ?SpO2: 98% 99%  ?  ?Last Pain:  ?Vitals:  ? 03/18/21 1346  ?TempSrc:   ?PainSc: 0-No pain  ? ? ?  ?  ?  ?  ?  ?  ? ?Barnet Glasgow ? ? ? ? ?

## 2021-03-18 NOTE — H&P (Signed)
H&P ? ?History of Present Illness: Mckenzie Sullivan is a 61 y.o. year old with a 7 mm proximal left ureteral stone. Mckenzie Sullivan has a history of nephrolithiasis and has had multiple interventions previously.  ? ?Past Medical History:  ?Diagnosis Date  ? Abdominal aortic atherosclerosis (HCC)   ? CHF (congestive heart failure) (HCC)   ? Complication of anesthesia   ? COPD (chronic obstructive pulmonary disease) (HCC)   ? Dyslipidemia   ? Hypertension   ? Kidney stones   ? Oxygen dependent   ? ? ?Past Surgical History:  ?Procedure Laterality Date  ? CESAREAN SECTION    ? TONSILLECTOMY    ? ? ?Home Medications:  ?Current Meds  ?Medication Sig  ? albuterol (VENTOLIN HFA) 108 (90 Base) MCG/ACT inhaler Inhale 1-2 puffs into the lungs.  ? amLODipine (NORVASC) 5 MG tablet Take 1 tablet (5 mg total) by mouth every morning.  ? atorvastatin (LIPITOR) 40 MG tablet Take 40 mg by mouth daily.  ? ezetimibe (ZETIA) 10 MG tablet Take 10 mg by mouth daily.  ? fluticasone (FLONASE) 50 MCG/ACT nasal spray Place into the nose.  ? HYDROcodone-acetaminophen (NORCO) 5-325 MG tablet Take 1-2 tablets by mouth every 6 (six) hours as needed.  ? ondansetron (ZOFRAN) 4 MG tablet Take 1 tablet (4 mg total) by mouth every 8 (eight) hours as needed for nausea or vomiting.  ? OTEZLA 30 MG TABS Take 1 tablet by mouth 2 (two) times daily.  ? tamsulosin (FLOMAX) 0.4 MG CAPS capsule Take 1 capsule (0.4 mg total) by mouth 2 (two) times daily.  ? TRELEGY ELLIPTA 200-62.5-25 MCG/ACT AEPB Take 1 puff by mouth daily.  ? venlafaxine XR (EFFEXOR-XR) 150 MG 24 hr capsule Take 150 mg by mouth daily.  ? ? ?Allergies:  ?Allergies  ?Allergen Reactions  ? Penicillins Itching  ? ? ?Family History  ?Problem Relation Age of Onset  ? Pneumonia Mother   ? ? ?Social History:  reports that she quit smoking about 13 years ago. Her smoking use included cigarettes. She has never used smokeless tobacco. She reports that she does not currently use drugs after having used the  following drugs: Marijuana. No history on file for alcohol use. ? ?ROS: ?A complete review of systems was performed.  All systems are negative except for pertinent findings as noted. ? ?Physical Exam:  ?Vital signs in last 24 hours: ?Temp:  [97.6 ?F (36.4 ?C)] 97.6 ?F (36.4 ?C) (03/16 1131) ?Pulse Rate:  [78] 78 (03/16 1131) ?Resp:  [18] 18 (03/16 1131) ?BP: (158)/(68) 158/68 (03/16 1131) ?SpO2:  [98 %] 98 % (03/16 1131) ?Weight:  [88 kg] 88 kg (03/16 1131) ?Constitutional:  Alert and oriented, No acute distress ?Cardiovascular: Regular rate and rhythm, No JVD ?Respiratory: Normal respiratory effort, Lungs clear bilaterally ?GI: Abdomen is soft, nontender, nondistended, no abdominal masses ?GU: No CVA tenderness ?Lymphatic: No lymphadenopathy ?Neurologic: Grossly intact, no focal deficits ?Psychiatric: Normal mood and affect ? ? ?Laboratory Data:  ?No results for input(s): WBC, HGB, HCT, PLT in the last 72 hours. ? ?No results for input(s): NA, K, CL, GLUCOSE, BUN, CALCIUM, CREATININE in the last 72 hours. ? ?Invalid input(s): CO3 ? ? ?Results for orders placed or performed during the hospital encounter of 03/18/21 (from the past 24 hour(s))  ?Glucose, capillary     Status: Abnormal  ? Collection Time: 03/18/21 11:37 AM  ?Result Value Ref Range  ? Glucose-Capillary 109 (H) 70 - 99 mg/dL  ? ?No results found for this  or any previous visit (from the past 240 hour(s)). ? ?Renal Function: ?No results for input(s): CREATININE in the last 168 hours. ?Estimated Creatinine Clearance: 50.4 mL/min (A) (by C-G formula based on SCr of 1.24 mg/dL (H)). ? ?Radiologic Imaging: ?No results found. ? ?Assessment:  ?61 yo F with proximal left ureteral stone ? ?Plan:  ?To OR for L ESWL ? ?Irine Seal, MD ?03/18/2021, 12:04 PM  ?[facility] ?Pager: [phone] ? ? ?

## 2021-03-18 NOTE — Transfer of Care (Signed)
Immediate Anesthesia Transfer of Care Note ? ?Patient: Mckenzie Sullivan ? ?Procedure(s) Performed: LEFT EXTRACORPOREAL SHOCK WAVE LITHOTRIPSY (ESWL) (Left) ? ?Patient Location: PACU ? ?Anesthesia Type:MAC ? ?Level of Consciousness: awake, alert  and oriented ? ?Airway & Oxygen Therapy: Patient Spontanous Breathing ? ?Post-op Assessment: Report given to RN and Post -op Vital signs reviewed and stable ? ?Post vital signs: Reviewed and stable ? ?Last Vitals:  ?Vitals Value Taken Time  ?BP    ?Temp    ?Pulse    ?Resp    ?SpO2    ? ? ?Last Pain:  ?Vitals:  ? 03/18/21 1131  ?TempSrc: Oral  ?PainSc: 0-No pain  ?   ? ?  ? ?Complications: No notable events documented. ?

## 2021-03-19 ENCOUNTER — Encounter (HOSPITAL_BASED_OUTPATIENT_CLINIC_OR_DEPARTMENT_OTHER): Payer: Self-pay | Admitting: Urology

## 2021-03-31 ENCOUNTER — Other Ambulatory Visit: Payer: Self-pay

## 2021-03-31 ENCOUNTER — Encounter: Payer: Self-pay | Admitting: Pulmonary Disease

## 2021-03-31 ENCOUNTER — Ambulatory Visit (INDEPENDENT_AMBULATORY_CARE_PROVIDER_SITE_OTHER): Payer: BC Managed Care – PPO | Admitting: Pulmonary Disease

## 2021-03-31 VITALS — BP 126/68 | HR 83 | Temp 98.0°F | Ht 63.0 in | Wt 196.6 lb

## 2021-03-31 DIAGNOSIS — J439 Emphysema, unspecified: Secondary | ICD-10-CM | POA: Diagnosis not present

## 2021-03-31 DIAGNOSIS — J9611 Chronic respiratory failure with hypoxia: Secondary | ICD-10-CM | POA: Diagnosis not present

## 2021-03-31 NOTE — Progress Notes (Signed)
? ?@Patient  ID: Mckenzie Sullivan, female    DOB: [birth year], 61 y.o.   MRN: [id number] ? ?Chief Complaint  ?Patient presents with  ? Consult  ?  Consult for pulmonary HTN. Referred by PCP. Pt is currently on 3L of O2  ? ? ?Referring provider: ?Ferd Hibbs, NP ? ?HPI:  ? ?61 y.o. woman whom we are seeing in consultation for evaluation of COPD and chronic hypoxemic respiratory failure.  Previously followed by pulmonary specialist in [place], note x2 reviewed.  Most recent cardiology note and current EMR reviewed.  Most recent cardiology note from [place] reviewed.  Additional cardiology note from 2019 in [place] reviewed. ? ?Patient was in normal state of health in 2017.  She went for surgery for kidney stones.  When she was awakened from anesthesia she had hypoxemia.  She is placed on oxygen.  Has been on oxygen ever since.  Often with dyspnea started at that time.  Not really dyspneic prior to that.  Did not know had an issue with oxygen.  She uses 3 L with exertion.  This is been relatively stable over the last few years.  She quit smoking in 2010.  She has a approximately 40 pack year smoking history prior to that.  Reviewed her most recent PFTs in 2022 that demonstrates mild to moderate fixed obstruction, moderate reduced DLCO.  Reviewed most recent cross-sectional imaging CT scan 2017 that demonstrated severe emphysema per report.  Reviewed recent CT kidney stone scan that does show signs of emphysematous changes in the lower lobes albeit mild.  Reviewed most recent echocardiogram 02/2021 that does not demonstrate or discuss any right ventricular or right-sided abnormality.  Reviewed most recent echocardiogram in [place] 03/2019 that revealed normal RV size and function, normal RA size, normal RA pressure, no significant tricuspid regurgitation to suggest pulmonary hypertension. ? ?She uses Trelegy and albuterol as needed.  She thinks they help a little bit.  She is not sure albuterol makes her feel any better,  does give her the shakes.  She is currently enrolled in lung cancer screening with upcoming scan next week.  She denies any history of exacerbations of her underlying breathing or COPD requiring prednisone or hospitalization. ? ?PMH: COPD, kidney stones ?Surgical history: Multiple lithotripsy, ureteral stent ?Family history: Mother history of pneumonia, no significant rest or illness in first relatives ?Social history: Former smoker, 40-pack-year, quit in 2010, [place], moved to [place] from [place] in 2023 as husband got a new job at [employer] ? ?Questionaires / Pulmonary Flowsheets:  ? ?ACT:  ?   ? View : No data to display.  ?  ?  ?  ? ? ?MMRC: ?   ? View : No data to display.  ?  ?  ?  ? ? ?Epworth:  ?   ? View : No data to display.  ?  ?  ?  ? ? ?Tests:  ? ?FENO:  ?No results found for: NITRICOXIDE ? ?PFT: ?   ? View : No data to display.  ?  ?  ?  ? ? ?WALK:  ?   ? View : No data to display.  ?  ?  ?  ? ? ?Imaging: ?Personally reviewed and as per EMR discussion this note ?DG Abd 1 View ? ?Result Date: 03/20/2021 ?CLINICAL DATA:  Pre lithotripsy evaluation of a left ureteral calculus. EXAM: ABDOMEN - 1 VIEW COMPARISON:  Abdomen and pelvis CT dated 03/09/2021. FINDINGS: Normal bowel gas pattern.  Mildly prominent stool. The previously demonstrated 7 mm left ureteral calculus is unchanged in position at the L4-5 level. Previously demonstrated tiny bilateral renal calculi are not visible on the current image. Stable bilateral pelvic phleboliths. Mild lumbar spine degenerative changes. IMPRESSION: No change in position of the previously demonstrated 7 mm left ureteral calculus. Electronically Signed   By: Claudie Revering M.D.   On: 03/20/2021 14:39  ? ?CT Renal Stone Study ? ?Result Date: 03/09/2021 ?CLINICAL DATA:  61 year old female with history of flank pain. Suspected kidney stone. EXAM: CT ABDOMEN AND PELVIS WITHOUT CONTRAST TECHNIQUE: Multidetector CT imaging of the  abdomen and pelvis was performed following the standard protocol without IV contrast. RADIATION DOSE REDUCTION: This exam was performed according to the departmental dose-optimization program which includes automated exposure control, adjustment of the mA and/or kV according to patient size and/or use of iterative reconstruction technique. COMPARISON:  No priors. FINDINGS: Lower chest: Bilateral breast implants are incidentally noted. Hepatobiliary: No definite suspicious cystic or solid hepatic lesions are confidently identified on today's noncontrast CT examination. Unenhanced appearance of the gallbladder is normal. Pancreas: No definite pancreatic mass or peripancreatic fluid collections or inflammatory changes are noted on today's noncontrast CT examination. Spleen: Unremarkable. Adrenals/Urinary Tract: In the proximal third of the left ureter there is an obstructive calculus measuring 5 x 6 x 7 mm, with severe proximal left hydroureteronephrosis and extensive left-sided perinephric stranding. No additional calculi are identified elsewhere along the course of either ureter, within the right renal collecting system, or in the lumen of the urinary bladder. No right hydroureteronephrosis. Unenhanced appearance of the right kidney and bilateral adrenal glands is otherwise normal. Unenhanced appearance of the urinary bladder is normal. Stomach/Bowel: Unenhanced appearance of the stomach is normal. No pathologic dilatation of small bowel or colon. Numerous colonic diverticulae are noted in the sigmoid colon, without surrounding inflammatory changes to suggest an acute diverticulitis at this time. The appendix is not confidently identified and may be surgically absent. Regardless, there are no inflammatory changes noted adjacent to the cecum to suggest the presence of an acute appendicitis at this time. Vascular/Lymphatic: Aortic atherosclerosis. No lymphadenopathy noted in the abdomen or pelvis. Reproductive: Uterus  and ovaries are unremarkable in appearance. Other: No significant volume of ascites.  No pneumoperitoneum. Musculoskeletal: There are no aggressive appearing lytic or blastic lesions noted in the visualized portions of the skeleton. IMPRESSION: 1. Obstructive 5 x 6 x 7 mm calculus in the proximal third of the left ureter with severe proximal left hydroureteronephrosis. 2. Aortic atherosclerosis. Electronically Signed   By: Vinnie Langton M.D.   On: 03/09/2021 05:31  ? ?PCV ECHOCARDIOGRAM COMPLETE ? ?Result Date: 03/16/2021 ?Echocardiogram 03/15/2021: Left ventricle cavity is normal in size and wall thickness. Normal global wall motion. Normal LV systolic function with EF 65%. Normal diastolic filling pattern. Mild (Grade I) aortic regurgitation. Normal right atrial pressure.   ? ?Lab Results: ?Personally reviewed ?CBC ?   ?Component Value Date/Time  ? WBC 12.0 (H) 03/09/2021 0447  ? RBC 4.38 03/09/2021 0447  ? HGB 11.8 (L) 03/09/2021 0447  ? HCT 35.8 (L) 03/09/2021 0447  ? PLT 308 03/09/2021 0447  ? MCV 81.7 03/09/2021 0447  ? MCH 26.9 03/09/2021 0447  ? MCHC 33.0 03/09/2021 0447  ? RDW 13.6 03/09/2021 0447  ? LYMPHSABS 2.4 03/09/2021 0447  ? MONOABS 0.8 03/09/2021 0447  ? EOSABS 0.3 03/09/2021 0447  ? BASOSABS 0.1 03/09/2021 0447  ? ? ?BMET ?   ?Component Value Date/Time  ?  NA 139 03/09/2021 0447  ? K 3.5 03/09/2021 0447  ? CL 101 03/09/2021 0447  ? CO2 25 03/09/2021 0447  ? GLUCOSE 122 (H) 03/09/2021 0447  ? BUN 15 03/09/2021 0447  ? CREATININE 1.24 (H) 03/09/2021 0447  ? CALCIUM 9.4 03/09/2021 0447  ? GFRNONAA 50 (L) 03/09/2021 0447  ? ? ?BNP ?No results found for: BNP ? ?ProBNP ?No results found for: PROBNP ? ?Specialty Problems   ?None ? ? ?Allergies  ?Allergen Reactions  ? Penicillins Itching  ? ? ? ?There is no immunization history on file for this patient. ? ?Past Medical History:  ?Diagnosis Date  ? Abdominal aortic atherosclerosis ([place])   ? CHF (congestive heart failure) ([place])   ? Complication of  anesthesia   ? COPD (chronic obstructive pulmonary disease) ([place])   ? Dyslipidemia   ? Hypertension   ? Kidney stones   ? Oxygen dependent   ? ? ?Tobacco History: ?Social History  ? ?Tobacco Use  ?Smoking Status Forme

## 2021-03-31 NOTE — Patient Instructions (Signed)
Nice to meet you ? ?You are on good medications for the breathing - no changes today ? ?You did desaturate (as expected) so we will send new updated orders to [facility], hopefully the insurance issue will be taken care of.  ? ?Return to clinic in 6 months or sooner as needed ?

## 2021-04-07 ENCOUNTER — Ambulatory Visit
Admission: RE | Admit: 2021-04-07 | Discharge: 2021-04-07 | Disposition: A | Discharge status: Home / Self Care | Payer: BC Managed Care – PPO | Source: Ambulatory Visit | Attending: Family Medicine | Admitting: Family Medicine

## 2021-04-07 DIAGNOSIS — F17200 Nicotine dependence, unspecified, uncomplicated: Secondary | ICD-10-CM

## 2021-04-23 ENCOUNTER — Encounter: Payer: Self-pay | Admitting: Pulmonary Disease

## 2021-04-24 NOTE — Telephone Encounter (Signed)
Yes to both.

## 2021-05-03 ENCOUNTER — Ambulatory Visit: Payer: BC Managed Care – PPO | Admitting: Cardiology

## 2021-05-03 ENCOUNTER — Encounter: Payer: Self-pay | Admitting: Cardiology

## 2021-05-03 VITALS — BP 138/60 | HR 74 | Temp 98.3°F | Resp 16 | Ht 63.0 in | Wt 196.6 lb

## 2021-05-03 DIAGNOSIS — I10 Essential (primary) hypertension: Secondary | ICD-10-CM

## 2021-05-03 DIAGNOSIS — Z9981 Dependence on supplemental oxygen: Secondary | ICD-10-CM

## 2021-05-03 DIAGNOSIS — I700 Atherosclerosis of aorta: Secondary | ICD-10-CM

## 2021-05-03 DIAGNOSIS — E785 Hyperlipidemia, unspecified: Secondary | ICD-10-CM

## 2021-05-03 DIAGNOSIS — J449 Chronic obstructive pulmonary disease, unspecified: Secondary | ICD-10-CM

## 2021-05-03 DIAGNOSIS — Z87891 Personal history of nicotine dependence: Secondary | ICD-10-CM

## 2021-05-03 MED ORDER — HYDROCHLOROTHIAZIDE 25 MG PO TABS: 25.0000 mg | ORAL_TABLET | Freq: Every morning | ORAL | 0 refills | Status: DC

## 2021-05-03 MED ORDER — LOSARTAN POTASSIUM 25 MG PO TABS: 25.0000 mg | ORAL_TABLET | Freq: Every day | ORAL | 0 refills | Status: DC

## 2021-05-03 NOTE — Progress Notes (Signed)
? ?Date:  05/03/2021  ? ?ID:  Mckenzie Sullivan, DOB [birth year], MRN [id number] ? ?PCP:  Lance Bosch, NP  ?Cardiologist:  Tessa Lerner, DO, [facility] (established care 03/15/2021) ?Former Cardiology Providers: Karleen Hampshire, PA; Dr. Laverta Baltimore (EP).  ? ?Date: 05/03/21 ?Last Office Visit: 03/15/2021 ? ?Chief Complaint  ?Patient presents with  ? Congestive Heart Failure  ? Follow-up  ? ? ?HPI  ?Mckenzie Sullivan is a 61 y.o. Caucasian female whose past medical history and cardiovascular risk factors include: HTN, dyslipidemia, diabetes mellitus type 2 (diet controlled), COPD requiring supplemental oxygen 2L at rest and 3 L with exertion, former smoker, atherosclerosis of the abdominal aorta.  ? ?Patient was referred to the practice for preoperative risk stratification prior to her upcoming lithotripsy and possible stent placement.  She carries a diagnosis of chronic HFpEF from her former cardiologist office at [facility] in [place].  Based on the records available in Care Everywhere patient's LVEF was reported to be normal without any significant valvular heart disease.  Clinically she is euvolemic and she was not on GDMT prior to establishing care.  I repeated an echocardiogram in the interim which notes preserved LVEF, normal diastolic function, no significant valvular heart disease. ? ?Since last office visit patient has successfully undergone lithotripsy without the need for urethral stent placement.  She has recovered well.  As part of her preoperative work-up should her office blood pressures are quite high and to prevent cancellation of her procedure she was started on amlodipine 5 mg p.o. daily.  She was asked to keep a log of her blood pressures and to bring it in at the next visit.  Home blood pressures independently reviewed as part of today's encounter and her average blood pressure at home while taking amlodipine is 146/73 with a pulse of 87.  She denies anginal discomfort or heart failure symptoms. ? ?In  the interim she is also had labs with her PCP and [facility] we will try to obtain records. ? ? ?ALLERGIES: ?Allergies  ?Allergen Reactions  ? Penicillins Itching  ? ? ?MEDICATION LIST PRIOR TO VISIT: ?Current Meds  ?Medication Sig  ? albuterol (VENTOLIN HFA) 108 (90 Base) MCG/ACT inhaler Inhale 1-2 puffs into the lungs.  ? atorvastatin (LIPITOR) 40 MG tablet Take 40 mg by mouth daily.  ? clobetasol ointment (TEMOVATE) 0.05 % Apply to hands and foots bid for 2 weeks then qd for 2 weeks then qod for 2 weeks Avoid face underarms and groin  ? ezetimibe (ZETIA) 10 MG tablet Take 10 mg by mouth daily.  ? fluticasone (FLONASE) 50 MCG/ACT nasal spray Place into the nose.  ? hydrochlorothiazide (HYDRODIURIL) 25 MG tablet Take 1 tablet (25 mg total) by mouth every morning.  ? hydrOXYzine (ATARAX) 10 MG tablet Take 1-2 tablets by mouth at bedtime.  ? losartan (COZAAR) 25 MG tablet Take 1 tablet (25 mg total) by mouth daily at 10 pm.  ? mupirocin ointment (BACTROBAN) 2 % Apply 1 application. topically as needed.  ? OTEZLA 30 MG TABS Take 1 tablet by mouth 2 (two) times daily.  ? tamsulosin (FLOMAX) 0.4 MG CAPS capsule Take 1 capsule (0.4 mg total) by mouth daily after supper.  ? TRELEGY ELLIPTA 200-62.5-25 MCG/ACT AEPB Take 1 puff by mouth daily.  ? venlafaxine XR (EFFEXOR-XR) 150 MG 24 hr capsule Take 150 mg by mouth daily.  ? [DISCONTINUED] amLODipine (NORVASC) 5 MG tablet Take 1 tablet (5 mg total) by mouth every morning.  ?  ? ?  PAST MEDICAL HISTORY: ?Past Medical History:  ?Diagnosis Date  ? Abdominal aortic atherosclerosis ([facility])   ? CHF (congestive heart failure) ([facility])   ? Complication of anesthesia   ? COPD (chronic obstructive pulmonary disease) ([facility])   ? Dyslipidemia   ? Hypertension   ? Kidney stones   ? Oxygen dependent   ? ? ?PAST SURGICAL HISTORY: ?Past Surgical History:  ?Procedure Laterality Date  ? CESAREAN SECTION    ? EXTRACORPOREAL SHOCK WAVE LITHOTRIPSY Left 03/18/2021  ? Procedure: LEFT  EXTRACORPOREAL SHOCK WAVE LITHOTRIPSY (ESWL);  Surgeon: Despina AriasMachen, Graham L, MD;  [facility];  Service: Urology;  Laterality: Left;  ? TONSILLECTOMY    ? ? ?FAMILY HISTORY: ?The patient family history includes Pneumonia in her mother. ? ?SOCIAL HISTORY:  ?The patient  reports that she quit smoking about 13 years ago. Her smoking use included cigarettes. She has never used smokeless tobacco. She reports current alcohol use. She reports that she does not currently use drugs after having used the following drugs: Marijuana. ? ?REVIEW OF SYSTEMS: ?Review of Systems  ?Cardiovascular:  Positive for dyspnea on exertion (chronic and stable.). Negative for chest pain, claudication, leg swelling, near-syncope, orthopnea, palpitations, paroxysmal nocturnal dyspnea and syncope.  ?Respiratory:  Positive for shortness of breath (chronic and stable.).   ? ?PHYSICAL EXAM: ? ?  05/03/2021  ?  3:44 PM 03/31/2021  ? 10:32 AM 03/18/2021  ?  1:46 PM  ?Vitals with BMI  ?Height 5\' 3"  5\' 3"    ?Weight 196 lbs 10 oz 196 lbs 10 oz   ?BMI 34.83 34.83   ?Systolic 138 126 [id number]  ?Diastolic 60 68 70  ?Pulse 74 83 72  ? ? ?CONSTITUTIONAL: Well-developed and well-nourished. No acute distress.  Nasal cannula oxygen. ?SKIN: Skin is warm and dry. No rash noted. No cyanosis. No pallor. No jaundice ?HEAD: Normocephalic and atraumatic.  ?EYES: No scleral icterus ?MOUTH/THROAT: Moist oral membranes.  ?NECK: No JVD present. No thyromegaly noted. No carotid bruits  ?LYMPHATIC: No visible cervical adenopathy.  ?CHEST Normal respiratory effort. No intercostal retractions  ?LUNGS: Clear to auscultation bilaterally upper lung fields with decreased breath sounds at the bases.  No stridor. No wheezes. No rales.  ?CARDIOVASCULAR: Regular rate and rhythm, positive S1-S2, no murmurs rubs or gallops appreciated. ?ABDOMINAL: Soft, nontender, nondistended, positive bowel sounds in all 4 quadrants no apparent ascites.  ?EXTREMITIES: No peripheral edema,  warm to touch, 2+ bilateral PT pulses. ?HEMATOLOGIC: No significant bruising ?NEUROLOGIC: Oriented to person, place, and time. Nonfocal. Normal muscle tone.  ?PSYCHIATRIC: Normal mood and affect. Normal behavior. Cooperative ? ?CARDIAC DATABASE: ?EKG: ?03/15/2021: NSR, 74 bpm, normal axis, negative precordial T waves.  ? ?Echocardiogram: ?[facility] (@ [facility] LVEF 60%. No significant valve disease. ? ?03/15/2021: ?Left ventricle cavity is normal in size and wall thickness. Normal global wall motion. Normal LV systolic function with EF 65%. Normal diastolic filling pattern.  ?Mild (Grade I) aortic regurgitation. ?Normal right atrial pressure.  ? ?Stress Testing: ?No results found for this or any previous visit from the past 1095 days. ? ? ?Heart Catheterization: ?Cardiac catheterization 10/06/2015 (@ [facility]); RA 16, RV 48/10, PA 48/21, PAOP 20, CO 4.86, CI 5.4, LVEDP 23 mmHg. LVEF 60%. Mild nonobstructive CAD; 30% narrowing of the origin of the first diagonal branch. ? ? ?LABORATORY DATA: ? ?  Latest Ref Rng & Units 03/09/2021  ?  4:47 AM  ?CBC  ?WBC 4.0 - 10.5 K/uL 12.0    ?  Hemoglobin 12.0 - 15.0 g/dL 04.8    ?Hematocrit 36.0 - 46.0 % 35.8    ?Platelets 150 - 400 K/uL 308    ? ? ? ?  Latest Ref Rng & Units 03/09/2021  ?  4:47 AM  ?CMP  ?Glucose 70 - 99 mg/dL 889    ?BUN 6 - 20 mg/dL 15    ?Creatinine 0.44 - 1.00 mg/dL 1.69    ?Sodium 135 - 145 mmol/L 139    ?Potassium 3.5 - 5.1 mmol/L 3.5    ?Chloride 98 - 111 mmol/L 101    ?CO2 22 - 32 mmol/L 25    ?Calcium 8.9 - 10.3 mg/dL 9.4    ? ? ?Lipid Panel  ?No results found for: CHOL, TRIG, HDL, CHOLHDL, VLDL, LDLCALC, LDLDIRECT, LABVLDL ? ?No components found for: NTPROBNP ?No results for input(s): PROBNP in the last 8760 hours. ?No results for input(s): TSH in the last 8760 hours. ? ?BMP ?Recent Labs  ?  03/09/21 ?0447  ?NA 139  ?K 3.5  ?CL 101  ?CO2 25  ?GLUCOSE 122*  ?BUN 15  ?CREATININE 1.24*  ?CALCIUM 9.4  ?GFRNONAA 50*  ? ? ?HEMOGLOBIN  A1C ?No results found for: HGBA1C, MPG ? ?IMPRESSION: ? ?  ICD-10-CM   ?1. Benign hypertension  I10 losartan (COZAAR) 25 MG tablet  ?  hydrochlorothiazide (HYDRODIURIL) 25 MG tablet  ?  Basic metabolic pa

## 2021-05-04 ENCOUNTER — Encounter: Payer: Self-pay | Admitting: Cardiology

## 2021-05-04 MED ORDER — TRELEGY ELLIPTA 100-62.5-25 MCG/ACT IN AEPB
1.0000 | INHALATION_SPRAY | Freq: Every day | RESPIRATORY_TRACT | 5 refills | Status: DC
Start: 2021-05-04 — End: 2022-01-28

## 2021-05-04 MED ORDER — ROFLUMILAST 500 MCG PO TABS: 500.0000 ug | ORAL_TABLET | Freq: Every day | ORAL | 5 refills | Status: DC

## 2021-05-05 NOTE — Telephone Encounter (Signed)
Labs have been printed so we can scan them into epic.

## 2021-05-08 ENCOUNTER — Emergency Department: Payer: BC Managed Care – PPO

## 2021-05-08 ENCOUNTER — Other Ambulatory Visit: Payer: Self-pay

## 2021-05-08 ENCOUNTER — Emergency Department
Admission: EM | Admit: 2021-05-08 | Discharge: 2021-05-08 | Disposition: A | Discharge status: Home / Self Care | Payer: BC Managed Care – PPO | Attending: Emergency Medicine | Admitting: Emergency Medicine

## 2021-05-08 ENCOUNTER — Ambulatory Visit
Admission: RE | Admit: 2021-05-08 | Discharge: 2021-05-08 | Disposition: A | Discharge status: Other Acute Inpatient Hospital | Payer: BC Managed Care – PPO | Source: Ambulatory Visit | Attending: Nurse Practitioner | Admitting: Nurse Practitioner

## 2021-05-08 VITALS — BP 148/66 | HR 100 | Temp 98.4°F | Resp 18

## 2021-05-08 DIAGNOSIS — L03113 Cellulitis of right upper limb: Secondary | ICD-10-CM

## 2021-05-08 DIAGNOSIS — M25521 Pain in right elbow: Secondary | ICD-10-CM

## 2021-05-08 DIAGNOSIS — I509 Heart failure, unspecified: Secondary | ICD-10-CM | POA: Insufficient documentation

## 2021-05-08 DIAGNOSIS — I110 Hypertensive heart disease with heart failure: Secondary | ICD-10-CM | POA: Diagnosis not present

## 2021-05-08 DIAGNOSIS — J449 Chronic obstructive pulmonary disease, unspecified: Secondary | ICD-10-CM | POA: Diagnosis not present

## 2021-05-08 DIAGNOSIS — M71121 Other infective bursitis, right elbow: Secondary | ICD-10-CM | POA: Diagnosis not present

## 2021-05-08 DIAGNOSIS — M25421 Effusion, right elbow: Secondary | ICD-10-CM

## 2021-05-08 LAB — COMPREHENSIVE METABOLIC PANEL
ALT: 21 U/L (ref 0–44)
AST: 21 U/L (ref 15–41)
Albumin: 4.3 g/dL (ref 3.5–5.0)
Alkaline Phosphatase: 123 U/L (ref 38–126)
Anion gap: 11 (ref 5–15)
BUN: 22 mg/dL — ABNORMAL HIGH (ref 6–20)
CO2: 29 mmol/L (ref 22–32)
Calcium: 11 mg/dL — ABNORMAL HIGH (ref 8.9–10.3)
Chloride: 96 mmol/L — ABNORMAL LOW (ref 98–111)
Creatinine, Ser: 0.83 mg/dL (ref 0.44–1.00)
GFR, Estimated: >60 mL/min (ref >60)
Glucose, Bld: 122 mg/dL — ABNORMAL HIGH (ref 70–99)
Potassium: 3.3 mmol/L — ABNORMAL LOW (ref 3.5–5.1)
Sodium: 136 mmol/L (ref 135–145)
Total Bilirubin: 0.4 mg/dL (ref 0.3–1.2)
Total Protein: 8.8 g/dL — ABNORMAL HIGH (ref 6.5–8.1)

## 2021-05-08 LAB — CBC WITH DIFFERENTIAL/PLATELET
Abs Immature Granulocytes: 0.07 10*3/uL (ref 0.00–0.07)
Basophils Absolute: 0.1 10*3/uL (ref 0.0–0.1)
Basophils Relative: 1 %
Eosinophils Absolute: 0.2 10*3/uL (ref 0.0–0.5)
Eosinophils Relative: 2 %
HCT: 41.3 % (ref 36.0–46.0)
Hemoglobin: 13.2 g/dL (ref 12.0–15.0)
Immature Granulocytes: 1 %
Lymphocytes Relative: 20 %
Lymphs Abs: 2.4 10*3/uL (ref 0.7–4.0)
MCH: 25.9 pg — ABNORMAL LOW (ref 26.0–34.0)
MCHC: 32 g/dL (ref 30.0–36.0)
MCV: 81 fL (ref 80.0–100.0)
Monocytes Absolute: 0.9 10*3/uL (ref 0.1–1.0)
Monocytes Relative: 8 %
Neutro Abs: 8.6 10*3/uL — ABNORMAL HIGH (ref 1.7–7.7)
Neutrophils Relative %: 68 %
Platelets: 458 10*3/uL — ABNORMAL HIGH (ref 150–400)
RBC: 5.1 MIL/uL (ref 3.87–5.11)
RDW: 14.4 % (ref 11.5–15.5)
WBC: 12.4 10*3/uL — ABNORMAL HIGH (ref 4.0–10.5)
nRBC: 0 % (ref 0.0–0.2)

## 2021-05-08 LAB — LACTIC ACID, PLASMA: Lactic Acid, Venous: 1.4 mmol/L (ref 0.5–1.9)

## 2021-05-08 LAB — CK: Total CK: 49 U/L (ref 38–234)

## 2021-05-08 MED ORDER — IOHEXOL 300 MG/ML  SOLN
75.0000 mL | Freq: Once | INTRAMUSCULAR | Status: AC | PRN
  Administered 2021-05-08: 75 mL via INTRAVENOUS

## 2021-05-08 MED ORDER — OXYCODONE-ACETAMINOPHEN 5-325 MG PO TABS
1.0000 | ORAL_TABLET | Freq: Once | ORAL | Status: AC
  Administered 2021-05-08: 1 via ORAL
  Filled 2021-05-08: qty 1

## 2021-05-08 MED ORDER — CLINDAMYCIN HCL 150 MG PO CAPS
450.0000 mg | ORAL_CAPSULE | Freq: Once | ORAL | Status: AC
  Administered 2021-05-08: 450 mg via ORAL
  Filled 2021-05-08: qty 3

## 2021-05-08 MED ORDER — OXYCODONE-ACETAMINOPHEN 5-325 MG PO TABS: 1.0000 | ORAL_TABLET | ORAL | 0 refills | Status: DC | PRN

## 2021-05-08 MED ORDER — CLINDAMYCIN HCL 300 MG PO CAPS: 300.0000 mg | ORAL_CAPSULE | Freq: Three times a day (TID) | ORAL | 0 refills | Status: AC

## 2021-05-08 NOTE — ED Provider Notes (Signed)
?[facility] ? ? ? ?CSN: [id number] ?Arrival date & time: 05/08/21  1355 ? ? ?  ? ?History   ?Chief Complaint ?Chief Complaint  ?Patient presents with  ? Arm Swelling  ? ? ?HPI ?Mckenzie Sullivan is a 61 y.o. female.  Accompanied by her husband, patient presents with bruising, pain, swelling of her right elbow x 2 days.  No falls or injury.  The area is developing a redness.  She is unable to fully extend her elbow without pain.  No open wounds, fever, chills, numbness, weakness, paresthesias, or other symptoms.  She is on baby aspirin daily.  Her medical history includes COPD, oxygen dependent, hypertension, congestive heart failure, kidney stones. ? ?The history is provided by the patient and medical records.  ? ?Past Medical History:  ?Diagnosis Date  ? Abdominal aortic atherosclerosis (HCC)   ? CHF (congestive heart failure) (HCC)   ? Complication of anesthesia   ? COPD (chronic obstructive pulmonary disease) (HCC)   ? Dyslipidemia   ? Hypertension   ? Kidney stones   ? Oxygen dependent   ? ? ?There are no problems to display for this patient. ? ? ?Past Surgical History:  ?Procedure Laterality Date  ? CESAREAN SECTION    ? EXTRACORPOREAL SHOCK WAVE LITHOTRIPSY Left 03/18/2021  ? Procedure: LEFT EXTRACORPOREAL SHOCK WAVE LITHOTRIPSY (ESWL);  Surgeon: Despina Arias, MD;  Location: [facility];  Service: Urology;  Laterality: Left;  ? TONSILLECTOMY    ? ? ?OB History   ?No obstetric history on file. ?  ? ? ? ?Home Medications   ? ?Prior to Admission medications   ?Medication Sig Start Date End Date Taking? Authorizing Provider  ?albuterol (VENTOLIN HFA) 108 (90 Base) MCG/ACT inhaler Inhale 1-2 puffs into the lungs. 04/12/18   [provider]  ?atorvastatin (LIPITOR) 40 MG tablet Take 40 mg by mouth daily. 01/23/21   [provider]  ?clobetasol ointment (TEMOVATE) 0.05 % Apply to hands and foots bid for 2 weeks then qd for 2 weeks then qod for 2 weeks Avoid face underarms and  groin 04/27/20   [provider]  ?ezetimibe (ZETIA) 10 MG tablet Take 10 mg by mouth daily. 01/23/21   [provider]  ?fluticasone (FLONASE) 50 MCG/ACT nasal spray Place into the nose. 04/12/18   [provider]  ?Fluticasone-Umeclidin-Vilant (TRELEGY ELLIPTA) 100-62.5-25 MCG/ACT AEPB Inhale 1 puff into the lungs daily. 05/04/21   Hunsucker, Lesia Sago, MD  ?hydrochlorothiazide (HYDRODIURIL) 25 MG tablet Take 1 tablet (25 mg total) by mouth every morning. 05/03/21 08/01/21  Tolia, Sunit, DO  ?hydrOXYzine (ATARAX) 10 MG tablet Take 1-2 tablets by mouth at bedtime. 04/27/20   [provider]  ?losartan (COZAAR) 25 MG tablet Take 1 tablet (25 mg total) by mouth daily at 10 pm. 05/03/21 08/01/21  Tessa Lerner, DO  ?mupirocin ointment (BACTROBAN) 2 % Apply 1 application. topically as needed. 12/10/20   [provider]  ?OTEZLA 30 MG TABS Take 1 tablet by mouth 2 (two) times daily. 02/22/21   [provider]  ?roflumilast (DALIRESP) 500 MCG TABS tablet Take 1 tablet (500 mcg total) by mouth daily. 05/04/21   Hunsucker, Lesia Sago, MD  ?tamsulosin (FLOMAX) 0.4 MG CAPS capsule Take 1 capsule (0.4 mg total) by mouth daily after supper. 03/18/21   Despina Arias, MD  ?Dwyane Luo 200-62.5-25 MCG/ACT AEPB Take 1 puff by mouth daily. 02/09/21   [provider]  ?venlafaxine XR (EFFEXOR-XR) 150 MG 24 hr  capsule Take 150 mg by mouth daily. 01/23/21   [provider]  ? ? ?Family History ?Family History  ?Problem Relation Age of Onset  ? Pneumonia Mother   ? ? ?Social History ?Social History  ? ?Tobacco Use  ? Smoking status: Former  ?  Years: 30.00  ?  Types: Cigarettes  ?  Quit date: 2010  ?  Years since quitting: 13.3  ? Smokeless tobacco: Never  ?Vaping Use  ? Vaping Use: Never used  ?Substance Use Topics  ? Alcohol use: Yes  ?  Comment: rarely  ? Drug use: Not Currently  ?  Types: Marijuana  ? ? ? ?Allergies   ?Penicillins and Cephalexin ? ? ?Review of Systems ?Review  of Systems  ?Constitutional:  Negative for chills and fever.  ?Musculoskeletal:  Positive for arthralgias and joint swelling.  ?Skin:  Positive for color change. Negative for wound.  ?Neurological:  Negative for weakness and numbness.  ?All other systems reviewed and are negative. ? ? ?Physical Exam ?Triage Vital Signs ?ED Triage Vitals  ?Enc Vitals Group  ?   BP 05/08/21 1407 (!) 148/66  ?   Pulse Rate 05/08/21 1407 100  ?   Resp 05/08/21 1407 18  ?   Temp 05/08/21 1407 98.4 ?F (36.9 ?C)  ?   Temp src --   ?   SpO2 05/08/21 1407 94 %  ?   Weight --   ?   Height --   ?   Head Circumference --   ?   Peak Flow --   ?   Pain Score 05/08/21 1414 5  ?   Pain Loc --   ?   Pain Edu? --   ?   Excl. in [place]? --   ? ?No data found. ? ?Updated Vital Signs ?BP (!) 148/66   Pulse 100   Temp 98.4 ?F (36.9 ?C)   Resp 18   SpO2 94%  ? ?Visual Acuity ?Right Eye Distance:   ?Left Eye Distance:   ?Bilateral Distance:   ? ?Right Eye Near:   ?Left Eye Near:    ?Bilateral Near:    ? ?Physical Exam ?Vitals and nursing note reviewed.  ?Constitutional:   ?   General: She is not in acute distress. ?   Appearance: Normal appearance. She is well-developed. She is not ill-appearing.  ?HENT:  ?   Mouth/Throat:  ?   Mouth: Mucous membranes are moist.  ?Cardiovascular:  ?   Rate and Rhythm: Normal rate and regular rhythm.  ?Pulmonary:  ?   Effort: Pulmonary effort is normal. No respiratory distress.  ?   Breath sounds: Normal breath sounds.  ?Musculoskeletal:     ?   General: Swelling and tenderness present.  ?   Cervical back: Neck supple.  ?   Comments: Generalized edema and tenderness of right elbow with ecchymosis and erythema.  No open wounds.  ?Skin: ?   General: Skin is warm and dry.  ?   Capillary Refill: Capillary refill takes less than 2 seconds.  ?   Findings: Bruising and erythema present. No lesion.  ?Neurological:  ?   General: No focal deficit present.  ?   Mental Status: She is alert and oriented to person, place, and time.  ?    Sensory: No sensory deficit.  ?   Motor: No weakness.  ?Psychiatric:     ?   Mood and Affect: Mood normal.     ?   Behavior: Behavior  normal.  ? ? ? ?UC Treatments / Results  ?Labs ?(all labs ordered are listed, but only abnormal results are displayed) ?Labs Reviewed - No data to display ? ?EKG ? ? ?Radiology ?No results found. ? ?Procedures ?Procedures (including critical care time) ? ?Medications Ordered in UC ?Medications - No data to display ? ?Initial Impression / Assessment and Plan / UC Course  ?I have reviewed the triage vital signs and the nursing notes. ? ?Pertinent labs & imaging results that were available during my care of the patient were reviewed by me and considered in my medical decision making (see chart for details). ? ?Pain and swelling of right elbow with cellulitis.  I am concerned for the possibility of septic arthritis.  Discussed limitations of evaluation in urgent care setting.  Discussed need for higher level of care, including possible stat blood work.  Sending patient to the ED for evaluation.  She is with her husband who will drive her to the ED. ? ? ?Final Clinical Impressions(s) / UC Diagnoses  ? ?Final diagnoses:  ?Pain and swelling of right elbow  ?Cellulitis of right upper extremity  ? ? ? ?Discharge Instructions   ? ?  ?Go to the emergency department for evaluation of possible septic arthritis. ? ? ? ? ?ED Prescriptions   ?None ?  ? ?PDMP not reviewed this encounter. ?  ?Mickie Bailate, Abram Sax H, NP ?05/08/21 1441 ? ?

## 2021-05-08 NOTE — ED Triage Notes (Signed)
Pt states she got a bruise on her R elbow that has progressively gotten worse over the last 2 days- spot is swollen and warm- pt has good blood flow to her fingers with cap refill <3 seconds- pt was seen at walk in clinic she sent her here to have blood work ?

## 2021-05-08 NOTE — ED Provider Notes (Signed)
? ?[facility] ?Provider Note ? ? ? Event Date/Time  ? First MD Initiated Contact with Patient 05/08/21 1618   ?  (approximate) ? ? ?History  ? ?Chief Complaint ?Arm Injury ? ? ?HPI ? ?Mckenzie Sullivan is a 61 y.o. female with past medical history of hypertension, hyperlipidemia, COPD on 2 L nasal cannula, and CHF who presents to the ED complaining of elbow pain.  Patient reports that she has been dealing with increasing pain and swelling around her right elbow for the past 2 days.  She denies any recent falls or other trauma to the area but pain and swelling has spread from the elbow along her right forearm.  She states the area feels hot to touch but she has not noticed any cuts to her skin and has not had any fevers.  She states she is able to bend her arm at the elbow but doing so is painful.  She has never had similar symptoms in the past and denies other recent infections.  She was initially evaluated at urgent care and referred to the ED for further evaluation due to concern for septic arthritis. ? ? ?Physical Exam  ? ?Triage Vital Signs: ?ED Triage Vitals  ?Enc Vitals Group  ?   BP 05/08/21 1502 (!) 144/68  ?   Pulse Rate 05/08/21 1502 (!) 108  ?   Resp 05/08/21 1502 18  ?   Temp 05/08/21 1502 98.6 ?F (37 ?C)  ?   Temp Source 05/08/21 1502 Oral  ?   SpO2 05/08/21 1502 100 %  ?   Weight --   ?   Height --   ?   Head Circumference --   ?   Peak Flow --   ?   Pain Score 05/08/21 1503 8  ?   Pain Loc --   ?   Pain Edu? --   ?   Excl. in [place]? --   ? ? ?Most recent vital signs: ?Vitals:  ? 05/08/21 1637 05/08/21 1700  ?BP: (!) 138/59 (!) 132/52  ?Pulse: 84 77  ?Resp: 19   ?Temp:    ?SpO2: 95% 97%  ? ? ?Constitutional: Alert and oriented. ?Eyes: Conjunctivae are normal. ?Head: Atraumatic. ?Nose: No congestion/rhinnorhea. ?Mouth/Throat: Mucous membranes are moist.  ?Cardiovascular: Normal rate, regular rhythm. Grossly normal heart sounds.  2+ radial pulses bilaterally. ?Respiratory: Normal  respiratory effort.  No retractions. Lungs CTAB. ?Gastrointestinal: Soft and nontender. No distention. ?Musculoskeletal: Edema, erythema, and warmth extending along from olecranon process to proximal third of right forearm dorsally.  There is mild erythema with overlying warmth and tenderness but no focal fluctuance.  She is able to range her right elbow with minimal pain.  No lower extremity tenderness nor edema.  ?Neurologic:  Normal speech and language. No gross focal neurologic deficits are appreciated. ? ? ? ?ED Results / Procedures / Treatments  ? ?Labs ?(all labs ordered are listed, but only abnormal results are displayed) ?Labs Reviewed  ?COMPREHENSIVE METABOLIC PANEL - Abnormal; Notable for the following components:  ?    Result Value  ? Potassium 3.3 (*)   ? Chloride 96 (*)   ? Glucose, Bld 122 (*)   ? BUN 22 (*)   ? Calcium 11.0 (*)   ? Total Protein 8.8 (*)   ? All other components within normal limits  ?CBC WITH DIFFERENTIAL/PLATELET - Abnormal; Notable for the following components:  ? WBC 12.4 (*)   ? MCH 25.9 (*)   ?  Platelets 458 (*)   ? Neutro Abs 8.6 (*)   ? All other components within normal limits  ?LACTIC ACID, PLASMA  ?CK  ? ?RADIOLOGY ?X-ray of right elbow reviewed by me with no fracture or dislocation. ? ?PROCEDURES: ? ?Critical Care performed: No ? ?Procedures ? ? ?MEDICATIONS ORDERED IN ED: ?Medications  ?oxyCODONE-acetaminophen (PERCOCET/ROXICET) 5-325 MG per tablet 1 tablet (1 tablet Oral Given 05/08/21 1710)  ?clindamycin (CLEOCIN) capsule 450 mg (450 mg Oral Given 05/08/21 1710)  ?iohexol (OMNIPAQUE) 300 MG/ML solution 75 mL (75 mLs Intravenous Contrast Given 05/08/21 1828)  ? ? ? ?IMPRESSION / MDM / ASSESSMENT AND PLAN / ED COURSE  ?I reviewed the triage vital signs and the nursing notes. ?             ?               ? ?61 y.o. female with past medical history of hypertension, hyperlipidemia, COPD on 2 L nasal cannula, and CHF who presents to the ED with atraumatic right elbow pain and  swelling that is now extending into the proximal part of her forearm. ? ?Differential diagnosis includes, but is not limited to, septic arthritis, cellulitis, septic bursitis, fracture, dislocation, hematoma. ? ?Patient nontoxic-appearing and in no acute distress, vital signs are reassuring and not consistent with sepsis.  Infectious process seems to be a likely etiology given erythema and warmth extending from elbow into proximal forearm, however she is able to range at her elbow and I doubt septic arthritis.  Edema does not appear to be localized at her bursa and I have low suspicion for septic bursitis at this time, symptoms seem most consistent with a cellulitis.  X-ray is unremarkable but we will further assess with CT scan to rule out abscess.  Patient reports allergy to cephalosporins and penicillins, we will treat with clindamycin for strep and staph coverage, she was also given dose of oral pain medicine.  Labs are reassuring with mild leukocytosis, lactic acid within normal limits and CK level is also within normal limits. ? ?CT is consistent with cellulitis and also shows rim enhancement of the olecranon bursa concerning for a septic bursitis.  Findings discussed with Dr. Sharlet Salina of orthopedics, who recommends against needle aspiration given overlying cellulitis.  He agrees with plan for outpatient management with clindamycin and close follow-up early next week in the office.  Patient prescribed clindamycin and short course of pain medication, was counseled to return to the ED for new worsening symptoms, patient agrees with plan. ? ?  ? ? ?FINAL CLINICAL IMPRESSION(S) / ED DIAGNOSES  ? ?Final diagnoses:  ?Septic bursitis of elbow, right  ?Cellulitis of right upper extremity  ? ? ? ?Rx / DC Orders  ? ?ED Discharge Orders   ? ?      Ordered  ?  clindamycin (CLEOCIN) 300 MG capsule  3 times daily       ? 05/08/21 1929  ?  oxyCODONE-acetaminophen (PERCOCET) 5-325 MG tablet  Every 4 hours PRN       ? 05/08/21  1929  ? ?  ?  ? ?  ? ? ? ?Note:  This document was prepared using Dragon voice recognition software and may include unintentional dictation errors. ?  ?Blake Divine, MD ?05/08/21 1931 ? ?

## 2021-05-08 NOTE — ED Triage Notes (Signed)
Pt here with right elbow swelling, bruising and pain x 2 days without a known injury.  ?

## 2021-05-08 NOTE — Discharge Instructions (Addendum)
Go to the emergency department for evaluation of possible septic arthritis. ?

## 2021-05-09 ENCOUNTER — Encounter: Payer: Self-pay | Admitting: Cardiology

## 2021-05-10 NOTE — Telephone Encounter (Signed)
From pt

## 2021-05-23 ENCOUNTER — Other Ambulatory Visit: Payer: Self-pay | Admitting: Cardiology

## 2021-06-08 NOTE — Progress Notes (Signed)
External Labs: Collected: May 25, 2021. BUN 13, creatinine 0.87. Sodium 143,Potassium 3.8, chloride 100, bicarb 29,. AST 18, ALT 18, alkaline phosphatase 130 (alkaline phosphatase above normal limits). Hemoglobin 11.3 g/dL, hematocrit 34.6%  Medical assistant: Please inform the patient that her renal function and electrolytes are within acceptable limits after initiation of losartan and hydrochlorothiazide.  Continue the current dose of potassium supplements as well.  Alkaline phosphatase is above normal limits-please discuss this further with PCP.  Mckenzie Sullivan [place], DO, [facility]

## 2021-06-10 NOTE — Progress Notes (Signed)
Called patient, Mckenzie Sullivan, LMAM

## 2021-06-11 NOTE — Progress Notes (Signed)
Called and spoke with patient regarding her recent lab results. Patient will discuss labs with her PCP.

## 2021-06-22 ENCOUNTER — Other Ambulatory Visit: Payer: Self-pay | Admitting: Cardiology

## 2021-06-22 ENCOUNTER — Other Ambulatory Visit: Payer: Self-pay

## 2021-06-22 DIAGNOSIS — R030 Elevated blood-pressure reading, without diagnosis of hypertension: Secondary | ICD-10-CM

## 2021-06-22 MED ORDER — TAMSULOSIN HCL 0.4 MG PO CAPS: ORAL_CAPSULE | ORAL | 0 refills | Status: DC

## 2021-07-22 ENCOUNTER — Other Ambulatory Visit: Payer: Self-pay | Admitting: Cardiology

## 2021-07-24 ENCOUNTER — Other Ambulatory Visit: Payer: Self-pay | Admitting: Cardiology

## 2021-07-24 DIAGNOSIS — I10 Essential (primary) hypertension: Secondary | ICD-10-CM

## 2021-08-21 ENCOUNTER — Other Ambulatory Visit: Payer: Self-pay | Admitting: Cardiology

## 2021-09-20 ENCOUNTER — Other Ambulatory Visit: Payer: Self-pay | Admitting: Cardiology

## 2021-09-20 DIAGNOSIS — R030 Elevated blood-pressure reading, without diagnosis of hypertension: Secondary | ICD-10-CM

## 2021-09-30 ENCOUNTER — Other Ambulatory Visit: Payer: Self-pay | Admitting: Urology

## 2021-10-02 NOTE — Patient Instructions (Addendum)
SURGICAL WAITING ROOM VISITATION Patients having surgery or a procedure may have no more than 2 support people in the waiting area - these visitors may rotate in the visitor waiting room.   Children under the age of 19 must have an adult with them who is not the patient. If the patient needs to stay at the hospital during part of their recovery, the visitor guidelines for inpatient rooms apply.  PRE-OP VISITATION  Pre-op nurse will coordinate an appropriate time for 1 support person to accompany the patient in pre-op.  This support person may not rotate.  This visitor will be contacted when the time is appropriate for the visitor to come back in the pre-op area.  Please refer to the [facility] website for the visitor guidelines for Inpatients (after your surgery is over and you are in a regular room).  You are not required to quarantine at this time prior to your surgery. However, you must do this: Hand Hygiene often Do NOT share personal items Notify your provider if you are in close contact with someone who has COVID or you develop fever 100.4 or greater, new onset of sneezing, cough, sore throat, shortness of breath or body aches.   If you received a COVID test during your pre-op visit  it is requested that you wear a mask when out in public, stay away from anyone that may not be feeling well and notify your surgeon if you develop symptoms. If you test positive for Covid or have been in contact with anyone that has tested positive in the last 10 days please notify you surgeon.       Your procedure is scheduled on: Friday  October 08, 2021   Report to [facility] Main Entrance.  Report to admitting at: 09:45 AM  +++++Call this number if you have any questions or problems the morning of surgery [phone]  Do not eat or drink anything After Midnight the night prior to your surgery/procedure.               FOLLOW BOWEL PREP AND ANY ADDITIONAL PRE OP INSTRUCTIONS YOU RECEIVED  FROM YOUR SURGEON'S OFFICE!!!   Oral Hygiene is also important to reduce your risk of infection.        Remember - BRUSH YOUR TEETH THE MORNING OF SURGERY WITH YOUR REGULAR TOOTHPASTE  Take ONLY these medicines the morning of surgery with A SIP OF WATER: venlafaxine (Effexor), roflumilast (Daliresp). You may use your Trelegy Ellipta and Albuterol.  If Needed you may use Flonase spray and take Toradol and Ondansetron (Zofran)                   You may not have any metal on your body including hair pins, jewelry, and body piercing  Do not wear make-up, lotions, powders, perfumes, or deodorant  Do not wear nail polish including gel and S&S, artificial / acrylic nails, or any other type of covering on natural nails including finger and toenails. If you have artificial nails, gel coating, etc., that needs to be removed by a nail salon, Please have this removed prior to surgery. Not doing so may mean that your surgery could be cancelled or delayed if the Surgeon or anesthesia staff feels like they are unable to monitor you safely.   Do not shave 48 hours prior to surgery to avoid nicks in your skin which may contribute to postoperative infections.   Contacts, Hearing Aids, dentures or bridgework may not be worn  into surgery.   DO NOT [place]. PHARMACY WILL DISPENSE MEDICATIONS LISTED ON YOUR MEDICATION LIST TO YOU DURING YOUR ADMISSION [place]!   Patients discharged on the day of surgery will not be allowed to drive home.  Someone NEEDS to stay with you for the first 24 hours after anesthesia.  Special Instructions: Bring a copy of your healthcare power of attorney and living will documents the day of surgery, if you wish to have them scanned into your [facility] Records- EPIC  Please read over the following fact sheets you were given: IF YOU HAVE QUESTIONS ABOUT YOUR PRE-OP INSTRUCTIONS, PLEASE CALL [phone]  ([place])   [place] -  Preparing for Surgery Before surgery, you can play an important role.  Because skin is not sterile, your skin needs to be as free of germs as possible.  You can reduce the number of germs on your skin by washing with CHG (chlorahexidine gluconate) soap before surgery.  CHG is an antiseptic cleaner which kills germs and bonds with the skin to continue killing germs even after washing. Please DO NOT use if you have an allergy to CHG or antibacterial soaps.  If your skin becomes reddened/irritated stop using the CHG and inform your nurse when you arrive at Short Stay. Do not shave (including legs and underarms) for at least 48 hours prior to the first CHG shower.  You may shave your face/neck.  Please follow these instructions carefully:  1.  Shower with CHG Soap the night before surgery and the  morning of surgery.  2.  If you choose to wash your hair, wash your hair first as usual with your normal  shampoo.  3.  After you shampoo, rinse your hair and body thoroughly to remove the shampoo.                             4.  Use CHG as you would any other liquid soap.  You can apply chg directly to the skin and wash.  Gently with a scrungie or clean washcloth.  5.  Apply the CHG Soap to your body ONLY FROM THE NECK DOWN.   Do not use on face/ open                           Wound or open sores. Avoid contact with eyes, ears mouth and genitals (private parts).                       Wash face,  Genitals (private parts) with your normal soap.             6.  Wash thoroughly, paying special attention to the area where your  surgery  will be performed.  7.  Thoroughly rinse your body with warm water from the neck down.  8.  DO NOT shower/wash with your normal soap after using and rinsing off the CHG Soap.            9.  Pat yourself dry with a clean towel.            10.  Wear clean pajamas.            11.  Place clean sheets on your bed the night of your first shower and do not  sleep with pets.  ON THE DAY  OF SURGERY :  Do not apply any lotions/deodorants the morning of surgery.  Please wear clean clothes to the hospital/surgery center.    FAILURE TO FOLLOW THESE INSTRUCTIONS MAY RESULT IN THE CANCELLATION OF YOUR SURGERY  PATIENT SIGNATURE_________________________________  NURSE SIGNATURE__________________________________  ________________________________________________________________________

## 2021-10-02 NOTE — Progress Notes (Signed)
Comments: Patient has RIGHT ARM RESTRICTION.   COVID Vaccine received:  []  No [x]  Yes Date of any COVID positive Test in last 90 days: None  PCP - Ferd Hibbs, NP Cardiologist - Rex Kras, DO  Chest x-ray - n/a EKG -  03-15-21  Epic Stress Test - n/a ECHO - 03-16-2021  Epic Cardiac Cath - n/a  Pacemaker/ICD device     [x]  N/A Spinal Cord Stimulator:[x]  No []  Yes      (Remind patient to bring remote DOS) Other Implants:   Bowel Prep - None per patient  History of Sleep Apnea? [x]  No []  Yes   Sleep Study Date:  2021  no OSA  CPAP used?- [x]  No []  Yes  (Instruct to bring their mask & Tubing)  Does the patient monitor blood sugar? []  No []  Yes  [x]  N/A  Blood Thinner Instructions: none Aspirin Instructions:  ASA 81 mg Last Dose:  per patient she stopped 10-04-2021  ERAS Protocol Ordered: [x]  No  []  Yes PRE-SURGERY []  ENSURE  []  G2    Activity level: Patient can not climb a flight of stairs without difficulty; she would have SOB, Patient is on 2 Liters of O2. Limited as to what ADLs she can do.    Anesthesia review: CHF, Aortic Regurg. And patient is O2 dependent. HTN  Patient potassium level was 2.9 on her PST bloodwork, [facility] [place]   Patient denies shortness of breath, fever, cough and chest pain at PAT appointment.  Patient verbalized understanding and agreement to the Pre-Surgical Instructions that were given to them at this PAT appointment. Patient was also educated of the need to review these PAT instructions again prior to his/her surgery.I reviewed the appropriate phone numbers to call if they have any and questions or concerns.

## 2021-10-04 ENCOUNTER — Encounter (HOSPITAL_COMMUNITY): Payer: Self-pay

## 2021-10-04 ENCOUNTER — Other Ambulatory Visit: Payer: Self-pay

## 2021-10-04 ENCOUNTER — Encounter (HOSPITAL_COMMUNITY)
Admission: RE | Admit: 2021-10-04 | Discharge: 2021-10-04 | Disposition: A | Discharge status: Home / Self Care | Payer: BC Managed Care – PPO | Source: Ambulatory Visit | Attending: Urology | Admitting: Urology

## 2021-10-04 VITALS — BP 130/60 | HR 68 | Temp 98.2°F | Resp 16 | Ht 63.0 in | Wt 200.0 lb

## 2021-10-04 DIAGNOSIS — I509 Heart failure, unspecified: Secondary | ICD-10-CM | POA: Diagnosis not present

## 2021-10-04 DIAGNOSIS — Z01812 Encounter for preprocedural laboratory examination: Secondary | ICD-10-CM | POA: Diagnosis present

## 2021-10-04 DIAGNOSIS — Z9981 Dependence on supplemental oxygen: Secondary | ICD-10-CM | POA: Insufficient documentation

## 2021-10-04 DIAGNOSIS — N201 Calculus of ureter: Secondary | ICD-10-CM | POA: Diagnosis not present

## 2021-10-04 DIAGNOSIS — I10 Essential (primary) hypertension: Secondary | ICD-10-CM

## 2021-10-04 DIAGNOSIS — I110 Hypertensive heart disease with heart failure: Secondary | ICD-10-CM | POA: Diagnosis not present

## 2021-10-04 DIAGNOSIS — Z87891 Personal history of nicotine dependence: Secondary | ICD-10-CM | POA: Diagnosis not present

## 2021-10-04 DIAGNOSIS — J449 Chronic obstructive pulmonary disease, unspecified: Secondary | ICD-10-CM | POA: Diagnosis not present

## 2021-10-04 HISTORY — DX: Personal history of urinary calculi: Z87.442

## 2021-10-04 HISTORY — DX: Pneumonia, unspecified organism: J18.9

## 2021-10-04 LAB — BASIC METABOLIC PANEL
Anion gap: 6 (ref 5–15)
BUN: 16 mg/dL (ref 6–20)
CO2: 31 mmol/L (ref 22–32)
Calcium: 9.3 mg/dL (ref 8.9–10.3)
Chloride: 99 mmol/L (ref 98–111)
Creatinine, Ser: 0.94 mg/dL (ref 0.44–1.00)
GFR, Estimated: >60 mL/min (ref >60)
Glucose, Bld: 91 mg/dL (ref 70–99)
Potassium: 2.9 mmol/L — ABNORMAL LOW (ref 3.5–5.1)
Sodium: 136 mmol/L (ref 135–145)

## 2021-10-04 LAB — CBC
HCT: 36.4 % (ref 36.0–46.0)
Hemoglobin: 11.6 g/dL — ABNORMAL LOW (ref 12.0–15.0)
MCH: 27 pg (ref 26.0–34.0)
MCHC: 31.9 g/dL (ref 30.0–36.0)
MCV: 84.8 fL (ref 80.0–100.0)
Platelets: 356 10*3/uL (ref 150–400)
RBC: 4.29 MIL/uL (ref 3.87–5.11)
RDW: 14.1 % (ref 11.5–15.5)
WBC: 11.7 10*3/uL — ABNORMAL HIGH (ref 4.0–10.5)
nRBC: 0 % (ref 0.0–0.2)

## 2021-10-05 NOTE — Progress Notes (Signed)
Anesthesia Chart Review   Case: [id number] Date/Time: 10/08/21 1145   Procedure: CYSTOSCOPY WITH LEFT RETROGRADE PYELOGRAM, URETEROSCOPY WIHT HOLMIUM LASER AND STENT PLACEMENT (Left) - 66 MINS FOR CASE   Anesthesia type: General   Pre-op diagnosis: LEFT URETERAL STONE   Location: [place] [facility]   Surgeons: Lucas Mallow, MD       DISCUSSION:61 y.o. former smoker with h/o HTN, COPD requiring supplemental oxygen 2L at rest and 3 L with exertion, CHF, left ureteral stone scheduled for above procedure 10/08/2021 with Dr. Link Snuffer.   Pt last seen by cardiology 05/03/2021.   Last seen by pulmonology 03/31/2021.  VS: BP 130/60 Comment: left arm sitting  Pulse 68   Temp 36.8 C (Oral)   Resp 16   Ht 5\' 3"  (1.6 m)   Wt 90.7 kg   SpO2 98% Comment: on 2L of O2  continuous  BMI 35.43 kg/m   PROVIDERS: Ferd Hibbs, NP  Cardiologist - Rex Kras, DO  LABS: Labs reviewed: Acceptable for surgery. (all labs ordered are listed, but only abnormal results are displayed)  Labs Reviewed  BASIC METABOLIC PANEL - Abnormal; Notable for the following components:      Result Value   Potassium 2.9 (*)    All other components within normal limits  CBC - Abnormal; Notable for the following components:   WBC 11.7 (*)    Hemoglobin 11.6 (*)    All other components within normal limits     IMAGES:   EKG:   CV: Echocardiogram 03/15/2021:  Left ventricle cavity is normal in size and wall thickness. Normal global  wall motion. Normal LV systolic function with EF 65%. Normal diastolic  filling pattern.  Mild (Grade I) aortic regurgitation.  Normal right atrial pressure.  Past Medical History:  Diagnosis Date   Abdominal aortic atherosclerosis (HCC)    CHF (congestive heart failure) ([place])    Complication of anesthesia 2017   problems with O2 sats   COPD (chronic obstructive pulmonary disease) (HCC)    Dyslipidemia    History of kidney stones    Hypertension     Kidney stones    Oxygen dependent    Pneumonia     Past Surgical History:  Procedure Laterality Date   CESAREAN SECTION     CYSTOSCOPY W/ URETERAL STENT PLACEMENT     has had numerous cystoscopies since 2017   [place] LITHOTRIPSY Left 03/18/2021   Procedure: LEFT EXTRACORPOREAL SHOCK WAVE LITHOTRIPSY (ESWL);  Surgeon: Vira Agar, MD;  Location: [facility];  Service: Urology;  Laterality: Left;   TONSILLECTOMY      MEDICATIONS:  albuterol (VENTOLIN HFA) 108 (90 Base) MCG/ACT inhaler   aspirin EC 81 MG tablet   atorvastatin (LIPITOR) 40 MG tablet   clobetasol ointment (TEMOVATE) 0.05 %   ezetimibe (ZETIA) 10 MG tablet   fluticasone (FLONASE) 50 MCG/ACT nasal spray   Fluticasone-Umeclidin-Vilant (TRELEGY ELLIPTA) 100-62.5-25 MCG/ACT AEPB   hydrochlorothiazide (HYDRODIURIL) 25 MG tablet   HYDROcodone-acetaminophen (NORCO/VICODIN) 5-325 MG tablet   hydrOXYzine (ATARAX) 10 MG tablet   ketorolac (TORADOL) 10 MG tablet   losartan (COZAAR) 25 MG tablet   mupirocin ointment (BACTROBAN) 2 %   ondansetron (ZOFRAN-ODT) 8 MG disintegrating tablet   OTEZLA 30 MG TABS   oxyCODONE-acetaminophen (PERCOCET) 5-325 MG tablet   OXYGEN   roflumilast (DALIRESP) 500 MCG TABS tablet   tamsulosin (FLOMAX) 0.4 MG CAPS capsule   venlafaxine XR (EFFEXOR-XR) 150 MG 24  hr capsule   No current facility-administered medications for this encounter.    Jodell Cipro Ward, PA-C [facility] [phone]

## 2021-10-08 ENCOUNTER — Ambulatory Visit (HOSPITAL_COMMUNITY): Payer: BC Managed Care – PPO | Admitting: Anesthesiology

## 2021-10-08 ENCOUNTER — Ambulatory Visit (HOSPITAL_COMMUNITY)
Admission: RE | Admit: 2021-10-08 | Discharge: 2021-10-08 | Disposition: A | Discharge status: Home / Self Care | Payer: BC Managed Care – PPO | Attending: Urology | Admitting: Urology

## 2021-10-08 ENCOUNTER — Encounter (HOSPITAL_COMMUNITY)
Admission: RE | Disposition: A | Discharge status: Home / Self Care | Payer: Self-pay | Source: Home / Self Care | Attending: Urology

## 2021-10-08 ENCOUNTER — Encounter (HOSPITAL_COMMUNITY): Payer: Self-pay | Admitting: Urology

## 2021-10-08 ENCOUNTER — Ambulatory Visit (HOSPITAL_COMMUNITY): Payer: BC Managed Care – PPO

## 2021-10-08 ENCOUNTER — Ambulatory Visit (HOSPITAL_COMMUNITY): Payer: BC Managed Care – PPO | Admitting: Physician Assistant

## 2021-10-08 DIAGNOSIS — I110 Hypertensive heart disease with heart failure: Secondary | ICD-10-CM | POA: Diagnosis not present

## 2021-10-08 DIAGNOSIS — N132 Hydronephrosis with renal and ureteral calculous obstruction: Secondary | ICD-10-CM | POA: Diagnosis present

## 2021-10-08 DIAGNOSIS — Z7982 Long term (current) use of aspirin: Secondary | ICD-10-CM | POA: Diagnosis not present

## 2021-10-08 DIAGNOSIS — E669 Obesity, unspecified: Secondary | ICD-10-CM | POA: Diagnosis not present

## 2021-10-08 DIAGNOSIS — I509 Heart failure, unspecified: Secondary | ICD-10-CM | POA: Insufficient documentation

## 2021-10-08 DIAGNOSIS — I10 Essential (primary) hypertension: Secondary | ICD-10-CM

## 2021-10-08 DIAGNOSIS — Z87891 Personal history of nicotine dependence: Secondary | ICD-10-CM | POA: Insufficient documentation

## 2021-10-08 DIAGNOSIS — Z6835 Body mass index (BMI) 35.0-35.9, adult: Secondary | ICD-10-CM | POA: Diagnosis not present

## 2021-10-08 DIAGNOSIS — Z9981 Dependence on supplemental oxygen: Secondary | ICD-10-CM | POA: Diagnosis not present

## 2021-10-08 DIAGNOSIS — J449 Chronic obstructive pulmonary disease, unspecified: Secondary | ICD-10-CM | POA: Diagnosis not present

## 2021-10-08 HISTORY — PX: CYSTOSCOPY WITH RETROGRADE PYELOGRAM, URETEROSCOPY AND STENT PLACEMENT: SHX5789

## 2021-10-08 LAB — POTASSIUM: Potassium: 3.2 mmol/L — ABNORMAL LOW (ref 3.5–5.1)

## 2021-10-08 SURGERY — CYSTOURETEROSCOPY, WITH RETROGRADE PYELOGRAM AND STENT INSERTION
Anesthesia: General | Laterality: Bilateral

## 2021-10-08 MED ORDER — DEXAMETHASONE SODIUM PHOSPHATE 10 MG/ML IJ SOLN
INTRAMUSCULAR | Status: AC
  Filled 2021-10-08: qty 1

## 2021-10-08 MED ORDER — PROPOFOL 500 MG/50ML IV EMUL
INTRAVENOUS | Status: DC | PRN
  Administered 2021-10-08: 50 ug/kg/min via INTRAVENOUS

## 2021-10-08 MED ORDER — OXYCODONE HCL 5 MG PO TABS: 5.0000 mg | ORAL_TABLET | Freq: Once | ORAL | Status: DC | PRN

## 2021-10-08 MED ORDER — ONDANSETRON HCL 4 MG/2ML IJ SOLN
INTRAMUSCULAR | Status: DC | PRN
  Administered 2021-10-08: 4 mg via INTRAVENOUS

## 2021-10-08 MED ORDER — PROPOFOL 10 MG/ML IV BOLUS
INTRAVENOUS | Status: DC | PRN
  Administered 2021-10-08: 160 mg via INTRAVENOUS

## 2021-10-08 MED ORDER — ACETAMINOPHEN 500 MG PO TABS
ORAL_TABLET | ORAL | Status: AC
  Administered 2021-10-08: 1000 mg via ORAL
  Filled 2021-10-08: qty 2

## 2021-10-08 MED ORDER — CIPROFLOXACIN IN D5W 400 MG/200ML IV SOLN
INTRAVENOUS | Status: AC
  Filled 2021-10-08: qty 200

## 2021-10-08 MED ORDER — MIDAZOLAM HCL 2 MG/2ML IJ SOLN
INTRAMUSCULAR | Status: AC
  Filled 2021-10-08: qty 2

## 2021-10-08 MED ORDER — PROMETHAZINE HCL 25 MG/ML IJ SOLN: 6.2500 mg | INTRAMUSCULAR | Status: DC | PRN

## 2021-10-08 MED ORDER — ONDANSETRON HCL 4 MG/2ML IJ SOLN
INTRAMUSCULAR | Status: AC
  Filled 2021-10-08: qty 2

## 2021-10-08 MED ORDER — OXYCODONE HCL 5 MG/5ML PO SOLN: 5.0000 mg | Freq: Once | ORAL | Status: DC | PRN

## 2021-10-08 MED ORDER — FENTANYL CITRATE PF 50 MCG/ML IJ SOSY: 25.0000 ug | PREFILLED_SYRINGE | INTRAMUSCULAR | Status: DC | PRN

## 2021-10-08 MED ORDER — LIDOCAINE 2% (20 MG/ML) 5 ML SYRINGE
INTRAMUSCULAR | Status: DC | PRN
  Administered 2021-10-08: 60 mg via INTRAVENOUS

## 2021-10-08 MED ORDER — MIDAZOLAM HCL 5 MG/5ML IJ SOLN
INTRAMUSCULAR | Status: DC | PRN
  Administered 2021-10-08: 2 mg via INTRAVENOUS

## 2021-10-08 MED ORDER — DEXAMETHASONE SODIUM PHOSPHATE 10 MG/ML IJ SOLN
INTRAMUSCULAR | Status: DC | PRN
  Administered 2021-10-08: 10 mg via INTRAVENOUS

## 2021-10-08 MED ORDER — ACETAMINOPHEN 500 MG PO TABS: 1000.0000 mg | ORAL_TABLET | Freq: Once | ORAL | Status: AC

## 2021-10-08 MED ORDER — LIDOCAINE HCL (PF) 2 % IJ SOLN
INTRAMUSCULAR | Status: AC
  Filled 2021-10-08: qty 5

## 2021-10-08 MED ORDER — ORAL CARE MOUTH RINSE: 15.0000 mL | Freq: Once | OROMUCOSAL | Status: AC

## 2021-10-08 MED ORDER — FENTANYL CITRATE (PF) 100 MCG/2ML IJ SOLN
INTRAMUSCULAR | Status: AC
  Filled 2021-10-08: qty 2

## 2021-10-08 MED ORDER — CIPROFLOXACIN IN D5W 400 MG/200ML IV SOLN
400.0000 mg | INTRAVENOUS | Status: AC
  Administered 2021-10-08: 400 mg via INTRAVENOUS

## 2021-10-08 MED ORDER — PROPOFOL 10 MG/ML IV BOLUS
INTRAVENOUS | Status: AC
  Filled 2021-10-08: qty 20

## 2021-10-08 MED ORDER — CHLORHEXIDINE GLUCONATE 0.12 % MT SOLN
15.0000 mL | Freq: Once | OROMUCOSAL | Status: AC
  Administered 2021-10-08: 15 mL via OROMUCOSAL

## 2021-10-08 MED ORDER — FENTANYL CITRATE (PF) 100 MCG/2ML IJ SOLN
INTRAMUSCULAR | Status: DC | PRN
  Administered 2021-10-08 (×2): 50 ug via INTRAVENOUS

## 2021-10-08 MED ORDER — LACTATED RINGERS IV SOLN: INTRAVENOUS | Status: DC

## 2021-10-08 SURGICAL SUPPLY — 23 items
BAG URO CATCHER STRL LF (MISCELLANEOUS) ×1 IMPLANT
BASKET LASER NITINOL 1.9FR (BASKET) IMPLANT
BASKET ZERO TIP NITINOL 2.4FR (BASKET) IMPLANT
CATH URETERAL DUAL LUMEN 10F (MISCELLANEOUS) IMPLANT
CATH URETL OPEN END 6FR 70 (CATHETERS) ×1 IMPLANT
CLOTH BEACON ORANGE TIMEOUT ST (SAFETY) ×1 IMPLANT
EXTRACTOR STONE 1.7FRX115CM (UROLOGICAL SUPPLIES) IMPLANT
GLOVE BIO SURGEON STRL SZ7.5 (GLOVE) ×1 IMPLANT
GOWN STRL REUS W/ TWL XL LVL3 (GOWN DISPOSABLE) ×1 IMPLANT
GOWN STRL REUS W/TWL XL LVL3 (GOWN DISPOSABLE) ×1
GUIDEWIRE ANG ZIPWIRE 038X150 (WIRE) IMPLANT
GUIDEWIRE STR DUAL SENSOR (WIRE) ×1 IMPLANT
KIT TURNOVER KIT A (KITS) IMPLANT
LASER FIB FLEXIVA PULSE ID 365 (Laser) IMPLANT
MANIFOLD NEPTUNE II (INSTRUMENTS) ×1 IMPLANT
PACK CYSTO (CUSTOM PROCEDURE TRAY) ×1 IMPLANT
SHEATH NAVIGATOR HD 11/13X28 (SHEATH) IMPLANT
SHEATH NAVIGATOR HD 11/13X36 (SHEATH) IMPLANT
STENT POLARIS LOOP 6FR X 24 CM (STENTS) IMPLANT
TRACTIP FLEXIVA PULS ID 200XHI (Laser) IMPLANT
TRACTIP FLEXIVA PULSE ID 200 (Laser) ×1
TUBING CONNECTING 10 (TUBING) ×1 IMPLANT
TUBING UROLOGY SET (TUBING) ×1 IMPLANT

## 2021-10-08 NOTE — Anesthesia Procedure Notes (Signed)
Procedure Name: LMA Insertion Date/Time: 10/08/2021 12:08 PM  Performed by: Lind Covert, CRNAPre-anesthesia Checklist: Patient identified, Emergency Drugs available, Suction available, Patient being monitored and Timeout performed Patient Re-evaluated:Patient Re-evaluated prior to induction Oxygen Delivery Method: Circle system utilized Preoxygenation: Pre-oxygenation with 100% oxygen Induction Type: IV induction LMA: LMA inserted LMA Size: 4.0 Tube type: Oral Number of attempts: 1 Placement Confirmation: positive ETCO2 and breath sounds checked- equal and bilateral Tube secured with: Tape Dental Injury: Teeth and Oropharynx as per pre-operative assessment

## 2021-10-08 NOTE — Interval H&P Note (Signed)
History and Physical Interval Note:  10/08/2021 11:53 AM  Mckenzie Sullivan  has presented today for surgery, with the diagnosis of LEFT URETERAL STONE.  The various methods of treatment have been discussed with the patient and family. After consideration of risks, benefits and other options for treatment, the patient has consented to  Procedure(s) with comments: CYSTOSCOPY WITH LEFT RETROGRADE PYELOGRAM, URETEROSCOPY WIHT HOLMIUM LASER AND STENT PLACEMENT (Left) - 60 MINS FOR CASE as a surgical intervention.  The patient's history has been reviewed, patient examined, no change in status, stable for surgery.  I have reviewed the patient's chart and labs.  Questions were answered to the patient's satisfaction.     Marton Redwood, III

## 2021-10-08 NOTE — Discharge Instructions (Addendum)

## 2021-10-08 NOTE — Anesthesia Preprocedure Evaluation (Addendum)
Anesthesia Evaluation  Patient identified by MRN, date of birth, ID band Patient awake    Reviewed: Allergy & Precautions, NPO status , Patient's Chart, lab work & pertinent test results  History of Anesthesia Complications Negative for: history of anesthetic complications  Airway Mallampati: II  TM Distance: >3 FB Neck ROM: Full    Dental  (+) Dental Advisory Given, Partial Lower, Chipped, Partial Upper   Pulmonary COPD,  COPD inhaler and oxygen dependent, former smoker,    Pulmonary exam normal        Cardiovascular hypertension, Pt. on medications Normal cardiovascular exam   '23 TTE - Normal EF. Mild AI    Neuro/Psych negative neurological ROS  negative psych ROS   GI/Hepatic negative GI ROS, Neg liver ROS,   Endo/Other   Obesity K 2.9   Renal/GU Renal disease     Musculoskeletal negative musculoskeletal ROS (+)   Abdominal   Peds  Hematology  (+) Blood dyscrasia, anemia ,   Anesthesia Other Findings   Reproductive/Obstetrics                            Anesthesia Physical Anesthesia Plan  ASA: 3  Anesthesia Plan: General   Post-op Pain Management: Tylenol PO (pre-op)* and Minimal or no pain anticipated   Induction: Intravenous  PONV Risk Score and Plan: 3 and Treatment may vary due to age or medical condition, Ondansetron, Dexamethasone and Midazolam  Airway Management Planned: LMA  Additional Equipment: None  Intra-op Plan:   Post-operative Plan: Extubation in OR  Informed Consent: I have reviewed the patients History and Physical, chart, labs and discussed the procedure including the risks, benefits and alternatives for the proposed anesthesia with the patient or authorized representative who has indicated his/her understanding and acceptance.     Dental advisory given  Plan Discussed with: CRNA and Anesthesiologist  Anesthesia Plan Comments:         Anesthesia Quick Evaluation

## 2021-10-08 NOTE — Transfer of Care (Signed)
Immediate Anesthesia Transfer of Care Note  Patient: Mckenzie Sullivan  Procedure(s) Performed: CYSTOSCOPY WITH LEFT RETROGRADE PYELOGRAM, URETEROSCOPY WIHT HOLMIUM LASER AND STENT PLACEMENT AND RIGHT RETROGRADE PYELOGRAM (Bilateral)  Patient Location: PACU  Anesthesia Type:General  Level of Consciousness: sedated  Airway & Oxygen Therapy: Patient Spontanous Breathing and Patient connected to face mask oxygen  Post-op Assessment: Report given to RN and Post -op Vital signs reviewed and stable  Post vital signs: Reviewed and stable  Last Vitals:  Vitals Value Taken Time  BP 121/56 10/08/21 1308  Temp    Pulse 71 10/08/21 1309  Resp 15 10/08/21 1309  SpO2 98 % 10/08/21 1309  Vitals shown include unvalidated device data.  Last Pain:  Vitals:   10/08/21 1024  TempSrc:   PainSc: 5       Patients Stated Pain Goal: 4 (03/03/29 4388)  Complications: No notable events documented.

## 2021-10-08 NOTE — H&P (Signed)
CC/HPI: CC: Left ureteral calculus  HPI:  03/09/2021  61 year old female with an obstructing 7 mm proximal left ureteral calculus with severe upstream hydroureteronephrosis. She went to the emergency department today where CT scan revealed this. She denies any fever, chill, nausea, vomiting. Pain is currently controlled. She has had several stones in the past for which she had multiple ureteroscopy's. She is on supplemental oxygen with a stated history of CHF, COPD. She recently moved here from [place]. Cardiologist is in [facility] and she has not establish care with another one here. She is on 81 mg aspirin twice a day. Otherwise denies any blood thinner. She has never had lithotripsy.   04/01/2021: 61 year old female who presents today after undergoing left-sided lithotripsy. She did very well this operatively and passed multiple fragments. She brings these with her today for analysis. She denies any fevers or chills or gross hematuria. She denies pain today.   09/28/2021: Karryn is a 61 year old female with a past medical history of nephrolithiasis, CHF, COPD. She is on supplemental oxygen. She underwent a lithotripsy for left-sided ureteral stone back in March of this year. She did well with this procedure and passed multiple fragments. Follow-up imaging was clear. She presents today for a 28-month follow-up and renal ultrasound. Unfortunately, on ultrasound she has significant left-sided hydronephrosis. She reports that her pain began about a week ago and she has been dealing with it at home with the use of ibuprofen and acetaminophen. We performed KUB today which did show some opacities within the anatomical location of the renal pelvis on the left side. However I was concerned that there may be more than 1 calculus and therefore advised a CT scan. CT imaging shows 4 opacities within the left ureter with severe hydronephrosis.     ALLERGIES: penicillin - Hives    MEDICATIONS: None   Notes:  Unknown and not listed in epic. The states she is on aspirin 81 mg but no other blood thinners.   GU PSH: ESWL - 03/18/2021, 2017       PSH Notes: She has had ureteroscopy x3 but not ESWL   NON-GU PSH: No Non-GU PSH    GU PMH: Ureteral calculus - 04/01/2021, - 03/09/2021 Ureteral obstruction secondary to calculous - 03/09/2021      PMH Notes: COPD, CHF   NON-GU PMH: Arthritis Depression Diabetes Type 2 Hypercholesterolemia    FAMILY HISTORY: No Family History    SOCIAL HISTORY: Marital Status: Married Preferred Language: English; Race: White Current Smoking Status: Patient does not smoke anymore. Has not smoked since 03/03/2008. Smoked for 30 years.   Tobacco Use Assessment Completed: Used Tobacco in last 30 days? Has never drank.  Drinks 1 caffeinated drink per day. Has not had a blood transfusion.    REVIEW OF SYSTEMS:    GU Review Female:   Patient denies frequent urination, hard to postpone urination, burning /pain with urination, get up at night to urinate, leakage of urine, stream starts and stops, trouble starting your stream, have to strain to urinate, and being pregnant.  Gastrointestinal (Upper):   Patient denies nausea, vomiting, and indigestion/ heartburn.  Gastrointestinal (Lower):   Patient denies diarrhea and constipation.  Constitutional:   Patient denies fever, night sweats, weight loss, and fatigue.  Skin:   Patient denies skin rash/ lesion and itching.  Musculoskeletal:   Patient denies back pain and joint pain.  Neurological:   Patient denies headaches and dizziness.  Psychologic:   Patient denies depression and anxiety.  Notes: Left flank pain    VITAL SIGNS:      09/28/2021 11:29 AM  BP 128/73 mmHg  Pulse 92 /min  Temperature 97.8 F / 36.5 C   GU PHYSICAL EXAMINATION:      Notes: Left CVA tenderness   MULTI-SYSTEM PHYSICAL EXAMINATION:    Constitutional: Well-nourished. No physical deformities. Normally developed. Good grooming.   Respiratory:  No labored breathing, no use of accessory muscles. On supplemental oxygen  Cardiovascular: Normal temperature, normal extremity pulses, no swelling, no varicosities.  Skin: No paleness, no jaundice, no cyanosis. No lesion, no ulcer, no rash.  Neurologic / Psychiatric: Oriented to time, oriented to place, oriented to person. No depression, no anxiety, no agitation.  Gastrointestinal: No mass, no tenderness, no rigidity, non obese abdomen.     Complexity of Data:  Source Of History:  Patient  Records Review:   Previous Doctor Records, Previous Patient Records  Urine Test Review:   Urinalysis  X-Ray Review: KUB: Reviewed Films. Reviewed Report. Discussed With Patient.  Renal Ultrasound: Reviewed Films. Reviewed Report. Discussed With Patient.  C.T. Abdomen/Pelvis: Reviewed Films. Reviewed Report. Discussed With Patient.     09/28/21  Urinalysis  Urine Appearance Clear   Urine Color Yellow   Urine Glucose Neg mg/dL  Urine Bilirubin Neg mg/dL  Urine Ketones Neg mg/dL  Urine Specific Gravity 1.015   Urine Blood Neg ery/uL  Urine pH 6.5   Urine Protein Neg mg/dL  Urine Urobilinogen 0.2 mg/dL  Urine Nitrites Neg   Urine Leukocyte Esterase Neg leu/uL   PROCEDURES:         C.T. Urogram - [id number]      Patient confirmed No Neulasta OnPro Device.          KUB - [id number]  A single view of the abdomen is obtained. Bilateral renal shadows are visualized. Within the left renal shadow there are 3 opacities near the anatomical location of the left renal pelvis. These measure approximately 4 to 5 mm. I do not appreciate any other opacities along the expected anatomical course of either ureter.      Patient confirmed No Neulasta OnPro Device.             Renal Ultrasound - [id number]  Right Kidney: Length: 12.04 cm Depth: 4.78 cm Cortical Width: 1.18 cm Width: 5.47cm  Left Kidney: Length: 13.46 cm Depth: 6.19 cm Cortical Width: 1.32 cm Width: 6.24cm  Left Kidney/Ureter:  1)Hydro with  Dilated Renal Pelvis measuring 3.30cm and Dilated Proximal Ureter measuring 1.54cm-------2)Appearance of 0.63cm Proximal Ureteral Stone-----3)0.76cm Mid Pole Stone----4) Multiple Subcentimeter hyperechoic areas with and without shadowing - ? stones vs vascular calcifications   Right Kidney/Ureter:  1)? 0.80cm Upper Pole Stone-----2) Multiple Subcentimeter hyperechoic areas with and without shadowing - ? stones vs vascular calcifications   Bladder:  PVR = Bladder not Visualized      Patient confirmed No Neulasta OnPro Device.            Urinalysis Dipstick Dipstick Cont'd  Color: Yellow Bilirubin: Neg mg/dL  Appearance: Clear Ketones: Neg mg/dL  Specific Gravity: 1.015 Blood: Neg ery/uL  pH: 6.5 Protein: Neg mg/dL  Glucose: Neg mg/dL Urobilinogen: 0.2 mg/dL    Nitrites: Neg    Leukocyte Esterase: Neg leu/uL         Ketoralac 60mg  - [id number], [id number] The area was prepped and cleaned using sterile technique. A band aid was applied. The pt tolerated well.   Qty: 60 Adm. By: Elam Dutch  Unit: mg Lot No  [id number]  Route: IM Exp. Date 06/04/2023  Freq: None Mfgr.:   Site: Left Buttock   ASSESSMENT:      ICD-10 Details  1 GU:   Ureteral calculus - N20.1 Left, Acute, Uncomplicated  2   Ureteral obstruction secondary to calculous - N13.2 Left, Acute, Uncomplicated   PLAN:            Medications New Meds: Ketorolac Tromethamine 10 mg tablet 1 tablet PO Q 8 H PRN for kidney stone pain  #15  0 Refill(s)  Hydrocodone-Acetaminophen 5 mg-325 mg tablet 1 tablet PO Q 4 H PRN For severe pain related to stones  #30  0 Refill(s)  Ondansetron Odt 8 mg tablet,disintegrating 1 tablet PO Q 6 H PRN for nausea  #20  0 Refill(s)  Pharmacy Name:  [id number]  Address:  [address] [place]   [place], [place] [address]  Phone:  [phone]  Fax:  [phone]            Orders Labs CULTURE, URINE  X-Rays: KUB    C.T. Stone Protocol Without I.V. Contrast - Hydro, KUB  inconclusive  X-Ray Notes: History:  Hematuria: Yes/No  Patient to see MD after exam: Yes/No  Previous exam: CT / IVP/ US/ KUB/ None  When:  Where:  Diabetic: Yes/ No  BUN/ Creatinine:  Date of last BUN Creatinine:  Weight in pounds:  Allergy- IV Contrast: Yes/ No  Conflicting diabetic meds: Yes/ No  Diabetic Meds:  Prior Authorization #[employer] [place] #[id number] Valid 09/28/21 thru 10/28/21             Schedule Return Visit/Planned Activity: Next Available Appointment - Schedule Surgery  Procedure: 09/28/2021 at [facility] - [id number] - Ketoralac 60mg  (Toradol Per 15 Mg) - , [id number]          Document Letter(s):  Created for Patient: Clinical Summary         Notes:   Renal ultrasound was concerning for left-sided hydronephrosis. KUB did show multiple stones that seem to be within the left renal pelvis. I advised CT imaging for concern of multiple stones within the left ureter. This did show a large mid left-sided ureteral stone as well as a distal left-sided ureteral stone and 3 stones within the left renal pelvis. Options for definitive stone management were discussed. She would like to pursue ureteroscopy. She will be given pain and nausea medicine as well as a dose of IM ketorolac today. She is given strict return to clinic precautions for fevers, chills, worsening symptomatology.   Urinalysis will be sent for precautionary culture today. Stone intervention was discussed in detail today. For ureteroscopy, the patient understands that there is a chance for a staged procedure. Patient also understands that there is risk for bleeding, infection, injury to surrounding organs, and general risks of anesthesia. The patient also understands the placement of a stent and the risks of stent placement including, risk for infection, the risk for pain, and the risk for injury.     Signed by [id number], NP on 09/28/21 at 2:12 PM (EDT

## 2021-10-08 NOTE — Op Note (Addendum)
Operative Note  Preoperative diagnosis:  1.  Left renal and ureteral calculi 2.  Right flank pain  Postoperative diagnosis: 1.  Left renal and ureteral calculi 2.  Right flank pain, no obvious ureteral calculus  Procedure(s): 1.  Cystoscopy with left retrograde pyelogram, left ureteroscopy with laser lithotripsy and ureteral stent placement 2.  Right retrograde pyelogram  Surgeon: Link Snuffer, MD  Assistants: None  Anesthesia: General  Complications: None immediate  EBL: Minimal  Specimens: 1.  None  Drains/Catheters: 1.  6 x 24 double-J ureteral stent  Intraoperative findings: 1.  Normal urethra and bladder 2.  Proximal to mid severely impacting left ureteral calculus.  Appeared like they were multiple stone fragments stacked upon each other with significant ureteral edema.  Unable to pass the wire.  Was able to carefully fragment the stones to visualize the lumen and passed a wire through and subsequently laser fragment the remainder of the ureteral stones.  Due to the significant ureteral edema and inflammation, I chose not to perform upper tract ureteroscopy. 3.  Severe hydroureteronephrosis.  4.  Right retrograde pyelogram revealed no filling defect and no hydronephrosis on the right.  Indication: 61 year old female with left renal and ureteral calculi presents for the previously mentioned operation.  Today, she was complaining of right-sided flank pain and stated she felt like she had a right-sided kidney stone so we also consented her for possible right with plans for a right retrograde pyelogram and if anything was abnormal perform right ureteroscopy.  Description of procedure:  The patient was identified and consent was obtained.  The patient was taken to the operating room and placed in the supine position.  The patient was placed under general anesthesia.  Perioperative antibiotics were administered.  The patient was placed in dorsal lithotomy.  Patient was prepped and  draped in a standard sterile fashion and a timeout was performed.  A 21 French rigid cystoscope was advanced into the urethra and into the bladder.  Complete cystoscopy was performed with findings noted above.  The right ureter was cannulated with an open-ended ureteral catheter and a retrograde pyelogram was performed with no abnormal findings.  I therefore chose not to perform a right ureteroscopy.  The left ureter was cannulated with a sensor wire which was advanced up the ureter and met resistance at the stone.  I tried to pass a Glidewire and this also met resistance at the stone.  I carefully advanced a single lumen semirigid ureteroscope up the ureter with no resistance and it easily passed up to the stone.  The ureter was wide open.  I was still unable to navigate a wire through the scope and passed the stone.  I withdrew the scope and exchanged for a dual-lumen semirigid ureteroscope which also passed easily up the ureter up to the stone.  I was still unable to pass a wire however.  The stone was readily visible and I carefully laser fragmented the stone.  It appeared like this was multiple stone fragments stacked upon each other with significant ureteral edema.  I laser fragmented the stones in the center taking care not to injure the ureter.  I was able to visualize the lumen and passed a sensor wire through the lumen into the kidney.  I drove the scope over the wire to confirm proper placement.  I was able to advance the scope into the renal pelvis.  I withdrew the scope keeping the wire in place.  I readvanced the semirigid ureteroscope alongside the  wire and laser fragmented the remainder of the stone in the ureter.  Given the significant ureteral edema and inflammation, I chose not to perform upper tract ureteroscopy and advanced a sheath into the ureter.  Instead, I chose to stent the ureter to allow for ureteral healing with plans for upper tract ureteroscopy for the remainder of the stone in a  couple of weeks or so.  For this reason, I advanced the scope up to the renal pelvis.  Retrograde pyelogram was performed through the scope with severe hydroureteronephrosis.  I then withdrew the scope visualizing the ureter upon removal.  Other than the significant ureteral edema at the level of stone impaction, there were no other ureteral stones and no ureteral injury was identified.  I backloaded the wire onto rigid cystoscope and advanced that into the bladder followed by routine placement of a 6 x 24 double-J ureteral stent on the left.  Fluoroscopy confirmed proximal placement and direct visualization confirmed a good coil within the bladder.  I drained the bladder and withdrew the scope.  Patient tolerated the procedure well and stable postoperative.  Plan: Follow-up in 2 to 3 weeks for repeat ureteroscopy.

## 2021-10-08 NOTE — Anesthesia Postprocedure Evaluation (Signed)
Anesthesia Post Note  Patient: Mckenzie Sullivan  Procedure(s) Performed: CYSTOSCOPY WITH LEFT RETROGRADE PYELOGRAM, URETEROSCOPY WIHT HOLMIUM LASER AND STENT PLACEMENT AND RIGHT RETROGRADE PYELOGRAM (Bilateral)     Patient location during evaluation: PACU Anesthesia Type: General Level of consciousness: awake and alert Pain management: pain level controlled Vital Signs Assessment: post-procedure vital signs reviewed and stable Respiratory status: spontaneous breathing, nonlabored ventilation, respiratory function stable and patient connected to nasal cannula oxygen Cardiovascular status: stable and blood pressure returned to baseline Anesthetic complications: no   No notable events documented.  Last Vitals:  Vitals:   10/08/21 1330 10/08/21 1340  BP: 117/60 (!) 141/65  Pulse: 71 74  Resp: 19 16  Temp:    SpO2: 93% 94%    Last Pain:  Vitals:   10/08/21 1340  TempSrc:   PainSc: 0-No pain                 Audry Pili

## 2021-10-08 NOTE — H&P (View-Only) (Signed)
CC/HPI: CC: Left ureteral calculus  HPI:  03/09/2021  60-year-old female with an obstructing 7 mm proximal left ureteral calculus with severe upstream hydroureteronephrosis. She went to the emergency department today where CT scan revealed this. She denies any fever, chill, nausea, vomiting. Pain is currently controlled. She has had several stones in the past for which she had multiple ureteroscopy's. She is on supplemental oxygen with a stated history of CHF, COPD. She recently moved here from [place]. Cardiologist is in [place] and she has not establish care with another one here. She is on 81 mg aspirin twice a day. Otherwise denies any blood thinner. She has never had lithotripsy.   04/01/2021: 61-year-old female who presents today after undergoing left-sided lithotripsy. She did very well this operatively and passed multiple fragments. She brings these with her today for analysis. She denies any fevers or chills or gross hematuria. She denies pain today.   09/28/2021: Mckenzie Sullivan is a 61-year-old female with a past medical history of nephrolithiasis, CHF, COPD. She is on supplemental oxygen. She underwent a lithotripsy for left-sided ureteral stone back in March of this year. She did well with this procedure and passed multiple fragments. Follow-up imaging was clear. She presents today for a 6-month follow-up and renal ultrasound. Unfortunately, on ultrasound she has significant left-sided hydronephrosis. She reports that her pain began about a week ago and she has been dealing with it at home with the use of ibuprofen and acetaminophen. We performed KUB today which did show some opacities within the anatomical location of the renal pelvis on the left side. However I was concerned that there may be more than 1 calculus and therefore advised a CT scan. CT imaging shows 4 opacities within the left ureter with severe hydronephrosis.     ALLERGIES: penicillin - Hives    MEDICATIONS: None   Notes:  Unknown and not listed in epic. The states she is on aspirin 81 mg but no other blood thinners.   GU PSH: ESWL - 03/18/2021, 2017       PSH Notes: She has had ureteroscopy x3 but not ESWL   NON-GU PSH: No Non-GU PSH    GU PMH: Ureteral calculus - 04/01/2021, - 03/09/2021 Ureteral obstruction secondary to calculous - 03/09/2021      PMH Notes: COPD, CHF   NON-GU PMH: Arthritis Depression Diabetes Type 2 Hypercholesterolemia    FAMILY HISTORY: No Family History    SOCIAL HISTORY: Marital Status: Married Preferred Language: English; Race: White Current Smoking Status: Patient does not smoke anymore. Has not smoked since 03/03/2008. Smoked for 30 years.   Tobacco Use Assessment Completed: Used Tobacco in last 30 days? Has never drank.  Drinks 1 caffeinated drink per day. Has not had a blood transfusion.    REVIEW OF SYSTEMS:    GU Review Female:   Patient denies frequent urination, hard to postpone urination, burning /pain with urination, get up at night to urinate, leakage of urine, stream starts and stops, trouble starting your stream, have to strain to urinate, and being pregnant.  Gastrointestinal (Upper):   Patient denies nausea, vomiting, and indigestion/ heartburn.  Gastrointestinal (Lower):   Patient denies diarrhea and constipation.  Constitutional:   Patient denies fever, night sweats, weight loss, and fatigue.  Skin:   Patient denies skin rash/ lesion and itching.  Musculoskeletal:   Patient denies back pain and joint pain.  Neurological:   Patient denies headaches and dizziness.  Psychologic:   Patient denies depression and anxiety.     Notes: Left flank pain    VITAL SIGNS:      09/28/2021 11:29 AM  BP 128/73 mmHg  Pulse 92 /min  Temperature 97.8 F / 36.5 C   GU PHYSICAL EXAMINATION:      Notes: Left CVA tenderness   MULTI-SYSTEM PHYSICAL EXAMINATION:    Constitutional: Well-nourished. No physical deformities. Normally developed. Good grooming.   Respiratory:  No labored breathing, no use of accessory muscles. On supplemental oxygen  Cardiovascular: Normal temperature, normal extremity pulses, no swelling, no varicosities.  Skin: No paleness, no jaundice, no cyanosis. No lesion, no ulcer, no rash.  Neurologic / Psychiatric: Oriented to time, oriented to place, oriented to person. No depression, no anxiety, no agitation.  Gastrointestinal: No mass, no tenderness, no rigidity, non obese abdomen.     Complexity of Data:  Source Of History:  Patient  Records Review:   Previous Doctor Records, Previous Patient Records  Urine Test Review:   Urinalysis  X-Ray Review: KUB: Reviewed Films. Reviewed Report. Discussed With Patient.  Renal Ultrasound: Reviewed Films. Reviewed Report. Discussed With Patient.  C.T. Abdomen/Pelvis: Reviewed Films. Reviewed Report. Discussed With Patient.     09/28/21  Urinalysis  Urine Appearance Clear   Urine Color Yellow   Urine Glucose Neg mg/dL  Urine Bilirubin Neg mg/dL  Urine Ketones Neg mg/dL  Urine Specific Gravity 1.015   Urine Blood Neg ery/uL  Urine pH 6.5   Urine Protein Neg mg/dL  Urine Urobilinogen 0.2 mg/dL  Urine Nitrites Neg   Urine Leukocyte Esterase Neg leu/uL   PROCEDURES:         C.T. Urogram - [id number]      Patient confirmed No Neulasta OnPro Device.          KUB - [id number]  A single view of the abdomen is obtained. Bilateral renal shadows are visualized. Within the left renal shadow there are 3 opacities near the anatomical location of the left renal pelvis. These measure approximately 4 to 5 mm. I do not appreciate any other opacities along the expected anatomical course of either ureter.      Patient confirmed No Neulasta OnPro Device.             Renal Ultrasound - [id number]  Right Kidney: Length: 12.04 cm Depth: 4.78 cm Cortical Width: 1.18 cm Width: 5.47cm  Left Kidney: Length: 13.46 cm Depth: 6.19 cm Cortical Width: 1.32 cm Width: 6.24cm  Left Kidney/Ureter:  1)Hydro with  Dilated Renal Pelvis measuring 3.30cm and Dilated Proximal Ureter measuring 1.54cm-------2)Appearance of 0.63cm Proximal Ureteral Stone-----3)0.76cm Mid Pole Stone----4) Multiple Subcentimeter hyperechoic areas with and without shadowing - ? stones vs vascular calcifications   Right Kidney/Ureter:  1)? 0.80cm Upper Pole Stone-----2) Multiple Subcentimeter hyperechoic areas with and without shadowing - ? stones vs vascular calcifications   Bladder:  PVR = Bladder not Visualized      Patient confirmed No Neulasta OnPro Device.            Urinalysis Dipstick Dipstick Cont'd  Color: Yellow Bilirubin: Neg mg/dL  Appearance: Clear Ketones: Neg mg/dL  Specific Gravity: 1.015 Blood: Neg ery/uL  pH: 6.5 Protein: Neg mg/dL  Glucose: Neg mg/dL Urobilinogen: 0.2 mg/dL    Nitrites: Neg    Leukocyte Esterase: Neg leu/uL         Ketoralac 60mg  - [id number], [id number] The area was prepped and cleaned using sterile technique. A band aid was applied. The pt tolerated well.   Qty: 60 Adm. By: Elam Dutch  Unit: mg Lot No  [id number]  Route: IM Exp. Date 06/04/2023  Freq: None Mfgr.:   Site: Left Buttock   ASSESSMENT:      ICD-10 Details  1 GU:   Ureteral calculus - N20.1 Left, Acute, Uncomplicated  2   Ureteral obstruction secondary to calculous - N13.2 Left, Acute, Uncomplicated   PLAN:            Medications New Meds: Ketorolac Tromethamine 10 mg tablet 1 tablet PO Q 8 H PRN for kidney stone pain  #15  0 Refill(s)  Hydrocodone-Acetaminophen 5 mg-325 mg tablet 1 tablet PO Q 4 H PRN For severe pain related to stones  #30  0 Refill(s)  Ondansetron Odt 8 mg tablet,disintegrating 1 tablet PO Q 6 H PRN for nausea  #20  0 Refill(s)  Pharmacy Name:  [id number] [place]  Address:  [address]   [place], [place] [address]  Phone:  [phone]  Fax:  [phone]            Orders Labs CULTURE, URINE  X-Rays: KUB    C.T. Stone Protocol Without I.V. Contrast - Hydro, KUB  inconclusive  X-Ray Notes: History:  Hematuria: Yes/No  Patient to see MD after exam: Yes/No  Previous exam: CT / IVP/ US/ KUB/ None  When:  Where:  Diabetic: Yes/ No  BUN/ Creatinine:  Date of last BUN Creatinine:  Weight in pounds:  Allergy- IV Contrast: Yes/ No  Conflicting diabetic meds: Yes/ No  Diabetic Meds:  Prior Authorization #: [employer] [place] Auth #[id number] Valid 09/28/21 thru 10/28/21             Schedule Return Visit/Planned Activity: Next Available Appointment - Schedule Surgery  Procedure: 09/28/2021 at [facility] - [id number] - Ketoralac 60mg (Toradol Per 15 Mg) - [id number], [id number]          Document Letter(s):  Created for Patient: Clinical Summary         Notes:   Renal ultrasound was concerning for left-sided hydronephrosis. KUB did show multiple stones that seem to be within the left renal pelvis. I advised CT imaging for concern of multiple stones within the left ureter. This did show a large mid left-sided ureteral stone as well as a distal left-sided ureteral stone and 3 stones within the left renal pelvis. Options for definitive stone management were discussed. She would like to pursue ureteroscopy. She will be given pain and nausea medicine as well as a dose of IM ketorolac today. She is given strict return to clinic precautions for fevers, chills, worsening symptomatology.   Urinalysis will be sent for precautionary culture today. Stone intervention was discussed in detail today. For ureteroscopy, the patient understands that there is a chance for a staged procedure. Patient also understands that there is risk for bleeding, infection, injury to surrounding organs, and general risks of anesthesia. The patient also understands the placement of a stent and the risks of stent placement including, risk for infection, the risk for pain, and the risk for injury.     Signed by Jennaya Davis, NP on 09/28/21 at 2:12 PM (EDT 

## 2021-10-09 ENCOUNTER — Encounter (HOSPITAL_COMMUNITY): Payer: Self-pay | Admitting: Urology

## 2021-10-18 ENCOUNTER — Other Ambulatory Visit: Payer: Self-pay | Admitting: Urology

## 2021-10-22 ENCOUNTER — Other Ambulatory Visit: Payer: Self-pay | Admitting: Cardiology

## 2021-10-22 DIAGNOSIS — I10 Essential (primary) hypertension: Secondary | ICD-10-CM

## 2021-10-25 NOTE — Progress Notes (Signed)
Spoke to patient to give her updated arrival time for surgery on 11-01-21.  Patient was advised to arrive at [place] and to check in at admitting.  Reminded patient no solid food or liquids after midnight.  No change in medical history per patient.   Patient was advised that she could take the following meds the morning of surgery with a sip of water:  Atorvastatin, Ezetimibe, Venlafaxine, Roflumilast, okay to use inhalers. Patient stated understanding and was advised to call if questions arise.

## 2021-10-30 NOTE — Anesthesia Preprocedure Evaluation (Signed)
Anesthesia Evaluation  Patient identified by MRN, date of birth, ID band Patient awake    Reviewed: Allergy & Precautions, NPO status , Patient's Chart, lab work & pertinent test results  Airway Mallampati: II  TM Distance: >3 FB Neck ROM: Full    Dental no notable dental hx. (+) Chipped, Partial Upper, Partial Lower, Dental Advisory Given   Pulmonary COPD,  COPD inhaler and oxygen dependent, former smoker,    Pulmonary exam normal breath sounds clear to auscultation       Cardiovascular hypertension, Normal cardiovascular exam Rhythm:Regular Rate:Normal  03/2021 Echo Normal global  wall motion. Normal LV systolic function with EF 65%.    Neuro/Psych    GI/Hepatic   Endo/Other    Renal/GU Renal diseaseLab Results      Component                Value               Date                      CREATININE               0.94                10/04/2021                K                        3.2 (L)             10/08/2021                  Musculoskeletal   Abdominal (+) + obese,   Peds  Hematology Lab Results      Component                Value               Date                         HGB                      11.6 (L)            10/04/2021                HCT                      36.4                10/04/2021               PLT                      356                 10/04/2021              Anesthesia Other Findings   Reproductive/Obstetrics                            Anesthesia Physical Anesthesia Plan  ASA: 3  Anesthesia Plan: General   Post-op Pain Management: Tylenol PO (pre-op)*   Induction: Intravenous  PONV Risk Score and Plan: 4 or greater and Treatment may vary due to age or medical condition, Midazolam and  Ondansetron  Airway Management Planned: LMA  Additional Equipment: None  Intra-op Plan:   Post-operative Plan:   Informed Consent: I have reviewed the patients History  and Physical, chart, labs and discussed the procedure including the risks, benefits and alternatives for the proposed anesthesia with the patient or authorized representative who has indicated his/her understanding and acceptance.     Dental advisory given  Plan Discussed with:   Anesthesia Plan Comments:        Anesthesia Quick Evaluation

## 2021-11-01 ENCOUNTER — Ambulatory Visit (HOSPITAL_COMMUNITY)
Admission: RE | Admit: 2021-11-01 | Discharge: 2021-11-01 | Disposition: A | Discharge status: Home / Self Care | Payer: BC Managed Care – PPO | Attending: Urology | Admitting: Urology

## 2021-11-01 ENCOUNTER — Ambulatory Visit (HOSPITAL_COMMUNITY): Payer: BC Managed Care – PPO | Admitting: Anesthesiology

## 2021-11-01 ENCOUNTER — Encounter (HOSPITAL_COMMUNITY): Payer: Self-pay | Admitting: Urology

## 2021-11-01 ENCOUNTER — Ambulatory Visit (HOSPITAL_COMMUNITY): Payer: BC Managed Care – PPO

## 2021-11-01 ENCOUNTER — Encounter (HOSPITAL_COMMUNITY)
Admission: RE | Disposition: A | Discharge status: Home / Self Care | Payer: Self-pay | Source: Home / Self Care | Attending: Urology

## 2021-11-01 ENCOUNTER — Other Ambulatory Visit: Payer: Self-pay

## 2021-11-01 DIAGNOSIS — N132 Hydronephrosis with renal and ureteral calculous obstruction: Secondary | ICD-10-CM | POA: Insufficient documentation

## 2021-11-01 DIAGNOSIS — E876 Hypokalemia: Secondary | ICD-10-CM

## 2021-11-01 DIAGNOSIS — D72829 Elevated white blood cell count, unspecified: Secondary | ICD-10-CM

## 2021-11-01 DIAGNOSIS — I509 Heart failure, unspecified: Secondary | ICD-10-CM | POA: Diagnosis not present

## 2021-11-01 DIAGNOSIS — J449 Chronic obstructive pulmonary disease, unspecified: Secondary | ICD-10-CM | POA: Insufficient documentation

## 2021-11-01 DIAGNOSIS — I110 Hypertensive heart disease with heart failure: Secondary | ICD-10-CM | POA: Insufficient documentation

## 2021-11-01 DIAGNOSIS — Z9981 Dependence on supplemental oxygen: Secondary | ICD-10-CM | POA: Diagnosis not present

## 2021-11-01 DIAGNOSIS — Z87891 Personal history of nicotine dependence: Secondary | ICD-10-CM | POA: Diagnosis not present

## 2021-11-01 HISTORY — PX: CYSTOSCOPY/URETEROSCOPY/HOLMIUM LASER/STENT PLACEMENT: SHX6546

## 2021-11-01 LAB — CBC
HCT: 37.2 % (ref 36.0–46.0)
Hemoglobin: 11.8 g/dL — ABNORMAL LOW (ref 12.0–15.0)
MCH: 26.6 pg (ref 26.0–34.0)
MCHC: 31.7 g/dL (ref 30.0–36.0)
MCV: 84 fL (ref 80.0–100.0)
Platelets: 381 10*3/uL (ref 150–400)
RBC: 4.43 MIL/uL (ref 3.87–5.11)
RDW: 14.3 % (ref 11.5–15.5)
WBC: 11.3 10*3/uL — ABNORMAL HIGH (ref 4.0–10.5)
nRBC: 0 % (ref 0.0–0.2)

## 2021-11-01 LAB — BASIC METABOLIC PANEL
Anion gap: 11 (ref 5–15)
BUN: 14 mg/dL (ref 6–20)
CO2: 25 mmol/L (ref 22–32)
Calcium: 9.2 mg/dL (ref 8.9–10.3)
Chloride: 101 mmol/L (ref 98–111)
Creatinine, Ser: 1.09 mg/dL — ABNORMAL HIGH (ref 0.44–1.00)
GFR, Estimated: 59 mL/min — ABNORMAL LOW (ref >60)
Glucose, Bld: 119 mg/dL — ABNORMAL HIGH (ref 70–99)
Potassium: 2.8 mmol/L — ABNORMAL LOW (ref 3.5–5.1)
Sodium: 137 mmol/L (ref 135–145)

## 2021-11-01 SURGERY — CYSTOSCOPY/URETEROSCOPY/HOLMIUM LASER/STENT PLACEMENT
Anesthesia: General | Laterality: Left

## 2021-11-01 MED ORDER — SODIUM CHLORIDE 0.9 % IR SOLN
Status: DC | PRN
  Administered 2021-11-01: 3000 mL via INTRAVESICAL

## 2021-11-01 MED ORDER — FENTANYL CITRATE (PF) 100 MCG/2ML IJ SOLN
INTRAMUSCULAR | Status: DC | PRN
  Administered 2021-11-01 (×2): 50 ug via INTRAVENOUS

## 2021-11-01 MED ORDER — HYDROCODONE-ACETAMINOPHEN 5-325 MG PO TABS: 1.0000 | ORAL_TABLET | ORAL | 0 refills | Status: DC | PRN

## 2021-11-01 MED ORDER — AMISULPRIDE (ANTIEMETIC) 5 MG/2ML IV SOLN: 10.0000 mg | Freq: Once | INTRAVENOUS | Status: DC | PRN

## 2021-11-01 MED ORDER — HYDROMORPHONE HCL 1 MG/ML IJ SOLN: 0.2500 mg | INTRAMUSCULAR | Status: DC | PRN

## 2021-11-01 MED ORDER — MIDAZOLAM HCL 2 MG/2ML IJ SOLN
INTRAMUSCULAR | Status: DC | PRN
  Administered 2021-11-01: 2 mg via INTRAVENOUS

## 2021-11-01 MED ORDER — ORAL CARE MOUTH RINSE: 15.0000 mL | Freq: Once | OROMUCOSAL | Status: AC

## 2021-11-01 MED ORDER — ACETAMINOPHEN 10 MG/ML IV SOLN: 1000.0000 mg | Freq: Once | INTRAVENOUS | Status: DC | PRN

## 2021-11-01 MED ORDER — ONDANSETRON HCL 4 MG/2ML IJ SOLN: 4.0000 mg | Freq: Once | INTRAMUSCULAR | Status: DC | PRN

## 2021-11-01 MED ORDER — PROPOFOL 10 MG/ML IV BOLUS
INTRAVENOUS | Status: AC
  Filled 2021-11-01: qty 20

## 2021-11-01 MED ORDER — CHLORHEXIDINE GLUCONATE 0.12 % MT SOLN
15.0000 mL | Freq: Once | OROMUCOSAL | Status: AC
  Administered 2021-11-01: 15 mL via OROMUCOSAL

## 2021-11-01 MED ORDER — LACTATED RINGERS IV SOLN: INTRAVENOUS | Status: DC

## 2021-11-01 MED ORDER — DEXAMETHASONE SODIUM PHOSPHATE 10 MG/ML IJ SOLN
INTRAMUSCULAR | Status: AC
  Filled 2021-11-01: qty 1

## 2021-11-01 MED ORDER — LIDOCAINE HCL (PF) 2 % IJ SOLN
INTRAMUSCULAR | Status: AC
  Filled 2021-11-01: qty 5

## 2021-11-01 MED ORDER — LIDOCAINE 2% (20 MG/ML) 5 ML SYRINGE
INTRAMUSCULAR | Status: DC | PRN
  Administered 2021-11-01: 100 mg via INTRAVENOUS

## 2021-11-01 MED ORDER — ONDANSETRON HCL 4 MG/2ML IJ SOLN
INTRAMUSCULAR | Status: AC
  Filled 2021-11-01: qty 2

## 2021-11-01 MED ORDER — PROPOFOL 10 MG/ML IV BOLUS
INTRAVENOUS | Status: DC | PRN
  Administered 2021-11-01: 180 mg via INTRAVENOUS

## 2021-11-01 MED ORDER — OXYCODONE HCL 5 MG PO TABS: 5.0000 mg | ORAL_TABLET | Freq: Once | ORAL | Status: DC | PRN

## 2021-11-01 MED ORDER — IOHEXOL 300 MG/ML  SOLN
INTRAMUSCULAR | Status: DC | PRN
  Administered 2021-11-01: 10 mL

## 2021-11-01 MED ORDER — OXYCODONE HCL 5 MG/5ML PO SOLN: 5.0000 mg | Freq: Once | ORAL | Status: DC | PRN

## 2021-11-01 MED ORDER — DEXAMETHASONE SODIUM PHOSPHATE 10 MG/ML IJ SOLN
INTRAMUSCULAR | Status: DC | PRN
  Administered 2021-11-01: 4 mg via INTRAVENOUS

## 2021-11-01 MED ORDER — FENTANYL CITRATE (PF) 100 MCG/2ML IJ SOLN
INTRAMUSCULAR | Status: AC
  Filled 2021-11-01: qty 2

## 2021-11-01 MED ORDER — PHENYLEPHRINE 80 MCG/ML (10ML) SYRINGE FOR IV PUSH (FOR BLOOD PRESSURE SUPPORT)
PREFILLED_SYRINGE | INTRAVENOUS | Status: DC | PRN
  Administered 2021-11-01 (×3): 80 ug via INTRAVENOUS

## 2021-11-01 MED ORDER — ONDANSETRON HCL 4 MG/2ML IJ SOLN
INTRAMUSCULAR | Status: DC | PRN
  Administered 2021-11-01: 4 mg via INTRAVENOUS

## 2021-11-01 MED ORDER — CIPROFLOXACIN IN D5W 400 MG/200ML IV SOLN
400.0000 mg | INTRAVENOUS | Status: AC
  Administered 2021-11-01: 400 mg via INTRAVENOUS
  Filled 2021-11-01: qty 200

## 2021-11-01 MED ORDER — MIDAZOLAM HCL 2 MG/2ML IJ SOLN
INTRAMUSCULAR | Status: AC
  Filled 2021-11-01: qty 2

## 2021-11-01 SURGICAL SUPPLY — 24 items
BAG URO CATCHER STRL LF (MISCELLANEOUS) ×1 IMPLANT
BASKET LASER NITINOL 1.9FR (BASKET) IMPLANT
BASKET ZERO TIP NITINOL 2.4FR (BASKET) IMPLANT
CATH URETERAL DUAL LUMEN 10F (MISCELLANEOUS) IMPLANT
CATH URETL OPEN END 6FR 70 (CATHETERS) ×1 IMPLANT
CLOTH BEACON ORANGE TIMEOUT ST (SAFETY) ×1 IMPLANT
EXTRACTOR STONE 1.7FRX115CM (UROLOGICAL SUPPLIES) IMPLANT
GLOVE BIO SURGEON STRL SZ7.5 (GLOVE) ×1 IMPLANT
GOWN STRL REUS W/ TWL XL LVL3 (GOWN DISPOSABLE) ×1 IMPLANT
GOWN STRL REUS W/TWL XL LVL3 (GOWN DISPOSABLE) ×1
GUIDEWIRE ANG ZIPWIRE 038X150 (WIRE) IMPLANT
GUIDEWIRE STR DUAL SENSOR (WIRE) ×1 IMPLANT
KIT TURNOVER KIT A (KITS) IMPLANT
LASER FIB FLEXIVA PULSE ID 365 (Laser) IMPLANT
MANIFOLD NEPTUNE II (INSTRUMENTS) ×1 IMPLANT
PACK CYSTO (CUSTOM PROCEDURE TRAY) ×1 IMPLANT
SHEATH NAVIGATOR HD 11/13X28 (SHEATH) IMPLANT
SHEATH NAVIGATOR HD 11/13X36 (SHEATH) IMPLANT
SHEATH NAVIGATOR HD 12/14X36 (SHEATH) IMPLANT
STENT URET 6FRX24 CONTOUR (STENTS) IMPLANT
TRACTIP FLEXIVA PULS ID 200XHI (Laser) IMPLANT
TRACTIP FLEXIVA PULSE ID 200 (Laser)
TUBING CONNECTING 10 (TUBING) ×1 IMPLANT
TUBING UROLOGY SET (TUBING) ×1 IMPLANT

## 2021-11-01 NOTE — Interval H&P Note (Signed)
History and Physical Interval Note:  11/01/2021 7:24 AM  Mckenzie Sullivan  has presented today for surgery, with the diagnosis of LEFT RENAL STONE.  The various methods of treatment have been discussed with the patient and family. After consideration of risks, benefits and other options for treatment, the patient has consented to  Procedure(s) with comments: CYSTOSCOPY/URETEROSCOPY/HOLMIUM LASER/STENT PLACEMENT (Left) - 1 HR FOR CASE as a surgical intervention.  The patient's history has been reviewed, patient examined, no change in status, stable for surgery.  I have reviewed the patient's chart and labs.  Questions were answered to the patient's satisfaction.     Marton Redwood, III

## 2021-11-01 NOTE — Op Note (Signed)
Operative Note  Preoperative diagnosis:  1.  Left renal calculi  Postoperative diagnosis: 1.  Left renal calculi  Procedure(s): 1.  Cystoscopy with left retrograde pyelogram, left ureteroscopy with stone extraction, ureteral stent exchange  Surgeon: Link Snuffer, MD  Assistants: None  Anesthesia: General  Complications: None immediate  EBL: Minimal  Specimens: 1.  Renal calculi  Drains/Catheters: 1.  6 x 24 double-J ureteral stent  Intraoperative findings: 1.  Normal urethra and bladder 2.  Left ureteroscopy revealed proximal inflammation of the ureter at the level of stone impaction from the prior procedure Intervention.  Significant edema within the kidney as well.  She had some debris within in the kidney.  There were several stones that were basket extracted.  Indication: 61 year old female status post left ureteroscopy with laser lithotripsy and ureteral stent placement presents for stage II ureteroscopy.  Description of procedure:  The patient was identified and consent was obtained.  The patient was taken to the operating room and placed in the supine position.  The patient was placed under general anesthesia.  Perioperative antibiotics were administered.  The patient was placed in dorsal lithotomy.  Patient was prepped and draped in a standard sterile fashion and a timeout was performed.  A 21 French rigid cystoscope was advanced into the urethra and into the bladder.  Complete cystoscopy was performed with no abnormal findings.  The stent was grasped and pulled just beyond the meatus.  A wire was advanced through the stent into the kidney under fluoroscopic guidance.  A semirigid ureteroscope was advanced alongside the wire up the ureter.  No stones were identified in the ureter.  A wire was advanced through the scope and into the kidney under fluoroscopic guidance.  Scope was withdrawn.  12 x 14 ureteral access sheath was advanced over the wire under continuous  fluoroscopic guidance up to the proximal ureter under continuous fluoroscopic guidance.  Inner sheath and wire were withdrawn.  Digital ureteroscopy was performed and several stones were basket extracted.  All stones were able to be basket extracted.  He had significant dilation of the calyces likely chronic.  Retrograde pyelogram revealed severe hydronephrosis again which is likely chronic for her.  I withdrew the scope and the access sheath visualizing the ureter upon removal.  There were no ureteral calculi and no ureteral injury was seen.  I backloaded the wire onto rigid cystoscope and advanced into the bladder followed by routine placement of a 6 x 24 double-J ureteral stent.  Fluoroscopy confirmed proximal placement and direct visualization confirmed a good coil within the bladder.  I drained the bladder with the stent.  Patient tolerated procedure well with stable postoperative.  Plan: Follow-up in 1 week for stent removal

## 2021-11-01 NOTE — Anesthesia Procedure Notes (Signed)
Procedure Name: LMA Insertion Date/Time: 11/01/2021 7:34 AM  Performed by: Niel Hummer, CRNAPre-anesthesia Checklist: Patient identified, Emergency Drugs available, Suction available and Patient being monitored Patient Re-evaluated:Patient Re-evaluated prior to induction Oxygen Delivery Method: Circle system utilized Preoxygenation: Pre-oxygenation with 100% oxygen Induction Type: IV induction LMA: LMA inserted LMA Size: 4.0 Number of attempts: 1 Dental Injury: Teeth and Oropharynx as per pre-operative assessment

## 2021-11-01 NOTE — Transfer of Care (Signed)
Immediate Anesthesia Transfer of Care Note  Patient: Mckenzie Sullivan  Procedure(s) Performed: CYSTOSCOPY/URETEROSCOPY/STENT PLACEMENT (Left)  Patient Location: PACU  Anesthesia Type:General  Level of Consciousness: awake, alert  and oriented  Airway & Oxygen Therapy: Patient Spontanous Breathing and Patient connected to face mask oxygen  Post-op Assessment: Report given to RN, Post -op Vital signs reviewed and stable and Patient moving all extremities X 4  Post vital signs: Reviewed and stable  Last Vitals:  Vitals Value Taken Time  BP 106/49   Temp    Pulse 74   Resp 12   SpO2 100     Last Pain:  Vitals:   11/01/21 0619  TempSrc: Oral         Complications: No notable events documented.

## 2021-11-01 NOTE — Anesthesia Postprocedure Evaluation (Signed)
Anesthesia Post Note  Patient: Mckenzie Sullivan  Procedure(s) Performed: CYSTOSCOPY/URETEROSCOPY/STENT PLACEMENT (Left)     Patient location during evaluation: PACU Anesthesia Type: General Level of consciousness: awake and alert Pain management: pain level controlled Vital Signs Assessment: post-procedure vital signs reviewed and stable Respiratory status: spontaneous breathing, nonlabored ventilation, respiratory function stable and patient connected to nasal cannula oxygen Cardiovascular status: blood pressure returned to baseline and stable Postop Assessment: no apparent nausea or vomiting Anesthetic complications: no   No notable events documented.  Last Vitals:  Vitals:   11/01/21 0900 11/01/21 0915  BP: 99/72 103/81  Pulse: 69   Resp: (!) 21   Temp:    SpO2: 94% 96%    Last Pain:  Vitals:   11/01/21 0915  TempSrc:   PainSc: 0-No pain                 Barnet Glasgow

## 2021-11-01 NOTE — H&P (Signed)
CC/HPI: CC: Left ureteral calculus  HPI:  03/09/2021  61 year old female with an obstructing 7 mm proximal left ureteral calculus with severe upstream hydroureteronephrosis. She went to the emergency department today where CT scan revealed this. She denies any fever, chill, nausea, vomiting. Pain is currently controlled. She has had several stones in the past for which she had multiple ureteroscopy's. She is on supplemental oxygen with a stated history of CHF, COPD. She recently moved here from [place]. Cardiologist is in [facility] and she has not establish care with another one here. She is on 81 mg aspirin twice a day. Otherwise denies any blood thinner. She has never had lithotripsy.    04/01/2021: 61 year old female who presents today after undergoing left-sided lithotripsy. She did very well this operatively and passed multiple fragments. She brings these with her today for analysis. She denies any fevers or chills or gross hematuria. She denies pain today.    09/28/2021: Mckenzie Sullivan is a 61 year old female with a past medical history of nephrolithiasis, CHF, COPD. She is on supplemental oxygen. She underwent a lithotripsy for left-sided ureteral stone back in March of this year. She did well with this procedure and passed multiple fragments. Follow-up imaging was clear. She presents today for a 37-month follow-up and renal ultrasound. Unfortunately, on ultrasound she has significant left-sided hydronephrosis. She reports that her pain began about a week ago and she has been dealing with it at home with the use of ibuprofen and acetaminophen. We performed KUB today which did show some opacities within the anatomical location of the renal pelvis on the left side. However I was concerned that there may be more than 1 calculus and therefore advised a CT scan. CT imaging shows 4 opacities within the left ureter with severe hydronephrosis.   11/01/2021 Patient status post left ureteroscopy with laser  lithotripsy and ureteral stent placement.  Presents for stage II ureteroscopy.     ALLERGIES: penicillin - Hives     MEDICATIONS: None   Notes: Unknown and not listed in epic. The states she is on aspirin 81 mg but no other blood thinners.    GU PSH: ESWL - 03/18/2021, 2017        PSH Notes: She has had ureteroscopy x3 but not ESWL    NON-GU PSH: No Non-GU PSH     GU PMH: Ureteral calculus - 04/01/2021, - 03/09/2021 Ureteral obstruction secondary to calculous - 03/09/2021      PMH Notes: COPD, CHF    NON-GU PMH: Arthritis Depression Diabetes Type 2 Hypercholesterolemia     FAMILY HISTORY: No Family History     SOCIAL HISTORY: Marital Status: Married Preferred Language: English; Race: White Current Smoking Status: Patient does not smoke anymore. Has not smoked since 03/03/2008. Smoked for 30 years.    Tobacco Use Assessment Completed: Used Tobacco in last 30 days? Has never drank.  Drinks 1 caffeinated drink per day. Has not had a blood transfusion.     REVIEW OF SYSTEMS:    GU Review Female:   Patient denies frequent urination, hard to postpone urination, burning /pain with urination, get up at night to urinate, leakage of urine, stream starts and stops, trouble starting your stream, have to strain to urinate, and being pregnant.  Gastrointestinal (Upper):   Patient denies nausea, vomiting, and indigestion/ heartburn.  Gastrointestinal (Lower):   Patient denies diarrhea and constipation.  Constitutional:   Patient denies fever, night sweats, weight loss, and fatigue.  Skin:   Patient denies skin rash/ lesion  and itching.  Musculoskeletal:   Patient denies back pain and joint pain.  Neurological:   Patient denies headaches and dizziness.  Psychologic:   Patient denies depression and anxiety.    Notes: Left flank pain     BP (!) 125/52   Pulse 75   Temp 98.1 F (36.7 C) (Oral)   Resp (!) 25   Ht 5\' 3"  (1.6 m)   Wt 90.7 kg   SpO2 95%   BMI 35.43 kg/m     GU  PHYSICAL EXAMINATION:       Notes: Left CVA tenderness    MULTI-SYSTEM PHYSICAL EXAMINATION:    Constitutional: Well-nourished. No physical deformities. Normally developed. Good grooming.   Respiratory: No labored breathing, no use of accessory muscles. On supplemental oxygen  Cardiovascular: Normal temperature, normal extremity pulses, no swelling, no varicosities.  Skin: No paleness, no jaundice, no cyanosis. No lesion, no ulcer, no rash.  Neurologic / Psychiatric: Oriented to time, oriented to place, oriented to person. No depression, no anxiety, no agitation.  Gastrointestinal: No mass, no tenderness, no rigidity, non obese abdomen.      ASSESSMENT:      ICD-10 Details  1 GU:   Ureteral calculus - N20.1 Left, Acute, Uncomplicated  2   Ureteral obstruction secondary to calculous - N13.2 Left, Acute, Uncomplicated    PLAN:    Proceed with stage II ureteroscopy.

## 2021-11-01 NOTE — Discharge Instructions (Addendum)

## 2021-11-02 ENCOUNTER — Encounter (HOSPITAL_COMMUNITY): Payer: Self-pay | Admitting: Urology

## 2021-11-04 ENCOUNTER — Ambulatory Visit: Payer: BC Managed Care – PPO | Admitting: Cardiology

## 2021-11-08 ENCOUNTER — Ambulatory Visit: Payer: BC Managed Care – PPO

## 2021-11-08 VITALS — BP 119/49 | HR 69 | Temp 97.5°F | Resp 16 | Ht 63.0 in | Wt 191.4 lb

## 2021-11-08 DIAGNOSIS — I10 Essential (primary) hypertension: Secondary | ICD-10-CM

## 2021-11-08 DIAGNOSIS — J449 Chronic obstructive pulmonary disease, unspecified: Secondary | ICD-10-CM

## 2021-11-08 DIAGNOSIS — E785 Hyperlipidemia, unspecified: Secondary | ICD-10-CM

## 2021-11-08 DIAGNOSIS — E876 Hypokalemia: Secondary | ICD-10-CM

## 2021-11-08 NOTE — Progress Notes (Signed)
Date:  11/08/2021   ID:  Mckenzie Sullivan, DOB November 26, 1960, MRN [id number]  PCP:  Lance Bosch, NP  Cardiologist:  Tessa Lerner, DO, [facility] (established care 03/15/2021) Former Cardiology Providers: Karleen Hampshire, PA; Dr. Laverta Baltimore (EP).   Date: 11/08/21 Last Office Visit: 03/15/2021  Chief Complaint  Patient presents with   Hypertension   Follow-up    6 month    HPI  Mckenzie Sullivan is a 61 y.o. Caucasian female whose past medical history and cardiovascular risk factors include: HTN, dyslipidemia, diabetes mellitus type 2 (diet controlled), COPD requiring supplemental oxygen 2L at rest and 3 L with exertion, former smoker, atherosclerosis of the abdominal aorta.   Patient was referred to the practice for preoperative risk stratification prior to her upcoming lithotripsy and possible stent placement.  She carries a diagnosis of chronic HFpEF from her former cardiologist office at [facility] in [place].  Based on the records available in Care Everywhere patient's LVEF was reported to be normal without any significant valvular heart disease.  Clinically she is euvolemic and she was not on GDMT prior to establishing care.  I repeated an echocardiogram in the interim which notes preserved LVEF, normal diastolic function, no significant valvular heart disease.  She presents today for 6 month follow-up. At last visit she was started on Losartan and hydrochlorothiazide given ongoing elevated blood pressure despite being on Amlodipine. She has had low potassium level on recent labs and has not been taking potassium supplements since starting hydrochlorothiazide. She does endorse mild leg and hip cramps. She denies chest pain, palpitations, leg edema, orthopnea, PND, TIA/syncope.   ALLERGIES: Allergies  Allergen Reactions   Penicillins Itching   Cephalexin Other (See Comments)    Upset stomach    MEDICATION LIST PRIOR TO VISIT: Current Meds  Medication Sig   albuterol (VENTOLIN  HFA) 108 (90 Base) MCG/ACT inhaler Inhale 1-2 puffs into the lungs every 6 (six) hours as needed for shortness of breath or wheezing.   aspirin EC 81 MG tablet Take 81 mg by mouth in the morning and at bedtime. Swallow whole.   atorvastatin (LIPITOR) 40 MG tablet Take 40 mg by mouth daily.   clobetasol ointment (TEMOVATE) 0.05 % Apply 1 Application topically daily as needed (psoriasis).   ezetimibe (ZETIA) 10 MG tablet Take 10 mg by mouth daily.   fluticasone (FLONASE) 50 MCG/ACT nasal spray Place 1 spray into both nostrils daily as needed (congestion).   Fluticasone-Umeclidin-Vilant (TRELEGY ELLIPTA) 100-62.5-25 MCG/ACT AEPB Inhale 1 puff into the lungs daily.   HYDROcodone-acetaminophen (NORCO/VICODIN) 5-325 MG tablet Take 1 tablet by mouth every 4 (four) hours as needed.   hydrOXYzine (ATARAX) 10 MG tablet Take 10-20 tablets by mouth at bedtime as needed for itching.   ketorolac (TORADOL) 10 MG tablet Take 10 mg by mouth 3 (three) times daily as needed for moderate pain.   losartan (COZAAR) 25 MG tablet TAKE ONE TABLET BY MOUTH ONE TIME DAILY AT 10PM   mupirocin ointment (BACTROBAN) 2 % Apply 1 application  topically daily as needed (psoriasis on feet).   ondansetron (ZOFRAN-ODT) 8 MG disintegrating tablet Take 8 mg by mouth every 6 (six) hours as needed for vomiting or nausea.   OTEZLA 30 MG TABS Take 30 mg by mouth 2 (two) times daily. Apremilast   OXYGEN Inhale 2.5 L into the lungs continuous.   roflumilast (DALIRESP) 500 MCG TABS tablet Take 1 tablet (500 mcg total) by mouth daily.   tamsulosin (FLOMAX) 0.4 MG CAPS capsule  TAKE ONE CAPSULE BY MOUTH ONE TIME DAILY AFTER SUPPER   venlafaxine XR (EFFEXOR-XR) 150 MG 24 hr capsule Take 150 mg by mouth daily.   [DISCONTINUED] hydrochlorothiazide (HYDRODIURIL) 25 MG tablet TAKE ONE TABLET BY MOUTH EVERY MORNING     PAST MEDICAL HISTORY: Past Medical History:  Diagnosis Date   Abdominal aortic atherosclerosis (HCC)    CHF (congestive heart  failure) (HCC)    Complication of anesthesia 2017   problems with O2 sats   COPD (chronic obstructive pulmonary disease) (HCC)    Dyslipidemia    History of kidney stones    Hypertension    Kidney stones    Oxygen dependent    Pneumonia     PAST SURGICAL HISTORY: Past Surgical History:  Procedure Laterality Date   CESAREAN SECTION     CYSTOSCOPY W/ URETERAL STENT PLACEMENT     has had numerous cystoscopies since 2017   CYSTOSCOPY WITH RETROGRADE PYELOGRAM, URETEROSCOPY AND STENT PLACEMENT Bilateral 10/08/2021   Procedure: CYSTOSCOPY WITH LEFT RETROGRADE PYELOGRAM, URETEROSCOPY WIHT HOLMIUM LASER AND STENT PLACEMENT AND RIGHT RETROGRADE PYELOGRAM;  Surgeon: Crista Elliot, MD;  Location: [facility];  Service: Urology;  Laterality: Bilateral;  60 MINS FOR CASE   CYSTOSCOPY/URETEROSCOPY/HOLMIUM LASER/STENT PLACEMENT Left 11/01/2021   Procedure: CYSTOSCOPY/URETEROSCOPY/STENT PLACEMENT;  Surgeon: Crista Elliot, MD;  Location: [facility];  Service: Urology;  Laterality: Left;  1 HR FOR CASE   EXTRACORPOREAL SHOCK WAVE LITHOTRIPSY Left 03/18/2021   Procedure: LEFT EXTRACORPOREAL SHOCK WAVE LITHOTRIPSY (ESWL);  Surgeon: Despina Arias, MD;  Location[facility];  Service: Urology;  Laterality: Left;   TONSILLECTOMY      FAMILY HISTORY: The patient family history includes Pneumonia in her mother.  SOCIAL HISTORY:  The patient  reports that she quit smoking about 13 years ago. Her smoking use included cigarettes. She has never used smokeless tobacco. She reports current alcohol use. She reports that she does not currently use drugs after having used the following drugs: Marijuana.  REVIEW OF SYSTEMS: Review of Systems  Cardiovascular:  Positive for dyspnea on exertion (chronic and stable.). Negative for chest pain, claudication, leg swelling, near-syncope, orthopnea, palpitations, paroxysmal nocturnal dyspnea and syncope.  Respiratory:  Positive for shortness of breath  (chronic and stable.).     PHYSICAL EXAM:    11/08/2021    9:27 AM 11/01/2021    9:15 AM 11/01/2021    9:00 AM  Vitals with BMI  Height 5\' 3"     Weight 191 lbs 6 oz    BMI 33.91    Systolic 119 103 99  Diastolic 49 81 72  Pulse 69  69    CONSTITUTIONAL: Well-developed and well-nourished. No acute distress.  Nasal cannula oxygen. SKIN: Skin is warm and dry. No rash noted. No cyanosis. No pallor. No jaundice HEAD: Normocephalic and atraumatic.  EYES: No scleral icterus MOUTH/THROAT: Moist oral membranes.  NECK: No JVD present. No thyromegaly noted. No carotid bruits  LYMPHATIC: No visible cervical adenopathy.  CHEST Normal respiratory effort. No intercostal retractions  LUNGS: Clear to auscultation bilaterally upper lung fields with decreased breath sounds at the bases.  No stridor. No wheezes. No rales.  CARDIOVASCULAR: Regular rate and rhythm, positive S1-S2, no murmurs rubs or gallops appreciated. ABDOMINAL: Soft, nontender, nondistended, positive bowel sounds in all 4 quadrants no apparent ascites.  EXTREMITIES: No peripheral edema, warm to touch, 2+ bilateral PT pulses. HEMATOLOGIC: No significant bruising NEUROLOGIC: Oriented to person, place, and time. Nonfocal. Normal muscle tone.  PSYCHIATRIC: Normal mood and affect. Normal behavior. Cooperative  CARDIAC DATABASE: EKG: 11/08/2021: Normal sinus rhythm at 74 bpm. Normal axis. Negative precordial T waves. Compared to previous EKG on 03/15/2021, no significant change.  [facility]: [facility] (@ [facility] LVEF 60%. No significant valve disease.  03/15/2021: Left ventricle cavity is normal in size and wall thickness. Normal global wall motion. Normal LV systolic function with EF 65%. Normal diastolic filling pattern.  Mild (Grade I) aortic regurgitation. Normal right atrial pressure.   Stress Testing: No results found for this or any previous visit from the past 1095 days.  Heart Catheterization: Cardiac  catheterization 10/06/2015 (@ [facility]); RA 16, RV 48/10, PA 48/21, PAOP 20, CO 4.86, CI 5.4, LVEDP 23 mmHg. LVEF 60%. Mild nonobstructive CAD; 30% narrowing of the origin of the first diagonal branch.   LABORATORY DATA:    Latest Ref Rng & Units 11/01/2021    6:36 AM 10/04/2021    2:30 PM 05/08/2021    3:04 PM  CBC  WBC 4.0 - 10.5 K/uL 11.3  11.7  12.4   Hemoglobin 12.0 - 15.0 g/dL 98.111.8  19.111.6  47.813.2   Hematocrit 36.0 - 46.0 % 37.2  36.4  41.3   Platelets 150 - 400 K/uL 381  356  458        Latest Ref Rng & Units 11/01/2021    6:36 AM 10/08/2021   10:34 AM 10/04/2021    2:30 PM  CMP  Glucose 70 - 99 mg/dL 295119   91   BUN 6 - 20 mg/dL 14   16   Creatinine 6.210.44 - 1.00 mg/dL 3.081.09   6.570.94   Sodium 846135 - 145 mmol/L 137   136   Potassium 3.5 - 5.1 mmol/L 2.8  3.2  2.9   Chloride 98 - 111 mmol/L 101   99   CO2 22 - 32 mmol/L 25   31   Calcium 8.9 - 10.3 mg/dL 9.2   9.3     Lipid Panel  No results found for: "CHOL", "TRIG", "HDL", "CHOLHDL", "VLDL", "LDLCALC", "LDLDIRECT", "LABVLDL"  BMP Recent Labs    05/08/21 1504 10/04/21 1430 10/08/21 1034 11/01/21 0636  NA 136 136  --  137  K 3.3* 2.9* 3.2* 2.8*  CL 96* 99  --  101  CO2 29 31  --  25  GLUCOSE 122* 91  --  119*  BUN 22* 16  --  14  CREATININE 0.83 0.94  --  1.09*  CALCIUM 11.0* 9.3  --  9.2  GFRNONAA >60 >60  --  59*   External Labs: Collected: May 25, 2021. BUN 13, creatinine 0.87. Sodium 143,Potassium 3.8, chloride 100, bicarb 29,. AST 18, ALT 18, alkaline phosphatase 130 (alkaline phosphatase above normal limits). Hemoglobin 11.3 g/dL, hematocrit 96.2%34.6%  HEMOGLOBIN A1C No results found for: "HGBA1C", "MPG"  IMPRESSION:    ICD-10-CM   1. Benign hypertension  I10 EKG 12-Lead    2. Hypokalemia  E87.6     3. Dyslipidemia  E78.5     4. Chronic obstructive pulmonary disease, unspecified COPD type (HCC)  J44.9        RECOMMENDATIONS: Mckenzie RyderMartha Sullivan is a 61 y.o. Caucasian female whose  past medical history and cardiac risk factors include: HTN, dyslipidemia, diabetes mellitus type 2 (diet controlled), COPD requiring supplemental oxygen 2L at rest and 3 L with exertion, former smoker, atherosclerosis of the abdominal aorta.   Benign hypertension Hypokalemia Given, hypokalemia and soft blood pressure  will stop HCTZ and continue Losartan 25mg  daily. She will have labs checked tomorrow at PCP, will request these lab results. Since we are stopping HCTZ will not start potassium supplement since she has not been taking hydrochlorothiazide for the past 2 days. I have advised her to eat foods that are high in potassium and have given her food options for this. She is not having any cardiac symptoms.  EKG unchanged from previous. I have advised her to check home blood pressure and keep a log of readings.  If blood pressure is not well controlled on just losartan would consider adding back hydrochlorothiazide with potassium supplements.  Dyslipidemia Currently on atorvastatin and Zetia. Will review labs from PCP. Does not endorse myalgias.  Chronic obstructive pulmonary disease, unspecified COPD type (HCC) / Oxygen dependent Currently on 2 L nasal cannula at rest and 3 L with exertion. Scheduled with pulmonology she establish care and discuss pulmonary rehab.  Patient establish care with myself in March 2023 after transitioning her care from [facility]) to [facility].  Based on electronic medical records she was given a diagnosis of HFpEF.  No supporting documentation to confirm this and repeat echocardiogram notes preserved LVEF and normal diastolic function therefore we will hold off on initiating GDMT.  Patient is encouraged to work on improving her modifiable cardiovascular risk factors.   FINAL MEDICATION LIST END OF ENCOUNTER: No orders of the defined types were placed in this encounter.   Medications Discontinued During This Encounter  Medication Reason    hydrochlorothiazide (HYDRODIURIL) 25 MG tablet Side effect (s)      Current Outpatient Medications:    albuterol (VENTOLIN HFA) 108 (90 Base) MCG/ACT inhaler, Inhale 1-2 puffs into the lungs every 6 (six) hours as needed for shortness of breath or wheezing., Disp: , Rfl:    aspirin EC 81 MG tablet, Take 81 mg by mouth in the morning and at bedtime. Swallow whole., Disp: , Rfl:    atorvastatin (LIPITOR) 40 MG tablet, Take 40 mg by mouth daily., Disp: , Rfl:    clobetasol ointment (TEMOVATE) 0.05 %, Apply 1 Application topically daily as needed (psoriasis)., Disp: , Rfl:    ezetimibe (ZETIA) 10 MG tablet, Take 10 mg by mouth daily., Disp: , Rfl:    fluticasone (FLONASE) 50 MCG/ACT nasal spray, Place 1 spray into both nostrils daily as needed (congestion)., Disp: , Rfl:    Fluticasone-Umeclidin-Vilant (TRELEGY ELLIPTA) 100-62.5-25 MCG/ACT AEPB, Inhale 1 puff into the lungs daily., Disp: 60 each, Rfl: 5   HYDROcodone-acetaminophen (NORCO/VICODIN) 5-325 MG tablet, Take 1 tablet by mouth every 4 (four) hours as needed., Disp: 12 tablet, Rfl: 0   hydrOXYzine (ATARAX) 10 MG tablet, Take 10-20 tablets by mouth at bedtime as needed for itching., Disp: , Rfl:    ketorolac (TORADOL) 10 MG tablet, Take 10 mg by mouth 3 (three) times daily as needed for moderate pain., Disp: , Rfl:    losartan (COZAAR) 25 MG tablet, TAKE ONE TABLET BY MOUTH ONE TIME DAILY AT 10PM, Disp: 90 tablet, Rfl: 0   mupirocin ointment (BACTROBAN) 2 %, Apply 1 application  topically daily as needed (psoriasis on feet)., Disp: , Rfl:    ondansetron (ZOFRAN-ODT) 8 MG disintegrating tablet, Take 8 mg by mouth every 6 (six) hours as needed for vomiting or nausea., Disp: , Rfl:    OTEZLA 30 MG TABS, Take 30 mg by mouth 2 (two) times daily. Apremilast, Disp: , Rfl:    OXYGEN, Inhale 2.5 L into the  lungs continuous., Disp: , Rfl:    roflumilast (DALIRESP) 500 MCG TABS tablet, Take 1 tablet (500 mcg total) by mouth daily., Disp: 30 tablet,  Rfl: 5   tamsulosin (FLOMAX) 0.4 MG CAPS capsule, TAKE ONE CAPSULE BY MOUTH ONE TIME DAILY AFTER SUPPER, Disp: 21 capsule, Rfl: 0   venlafaxine XR (EFFEXOR-XR) 150 MG 24 hr capsule, Take 150 mg by mouth daily., Disp: , Rfl:   Orders Placed This Encounter  Procedures   EKG 12-Lead    There are no Patient Instructions on file for this visit.   --Continue cardiac medications as reconciled in final medication list. --Return in about 6 months (around 05/09/2022) for HTN, HLD. Or sooner if needed. --Continue follow-up with your primary care physician regarding the management of your other chronic comorbid conditions.  Patient's questions and concerns were addressed to her satisfaction. She voices understanding of the instructions provided during this encounter.   This note was created using a voice recognition software as a result there may be grammatical errors inadvertently enclosed that do not reflect the nature of this encounter. Every attempt is made to correct such errors.    Ernst Spell, [place] Pager: [phone] Office: [phone]

## 2021-11-12 ENCOUNTER — Other Ambulatory Visit: Payer: Self-pay

## 2021-11-16 ENCOUNTER — Encounter: Payer: Self-pay | Admitting: Pulmonary Disease

## 2021-11-16 ENCOUNTER — Ambulatory Visit (INDEPENDENT_AMBULATORY_CARE_PROVIDER_SITE_OTHER): Payer: BC Managed Care – PPO | Admitting: Pulmonary Disease

## 2021-11-16 ENCOUNTER — Ambulatory Visit (INDEPENDENT_AMBULATORY_CARE_PROVIDER_SITE_OTHER): Payer: BC Managed Care – PPO

## 2021-11-16 VITALS — BP 124/64 | HR 83 | Wt 197.1 lb

## 2021-11-16 DIAGNOSIS — R0609 Other forms of dyspnea: Secondary | ICD-10-CM

## 2021-11-16 DIAGNOSIS — J432 Centrilobular emphysema: Secondary | ICD-10-CM | POA: Diagnosis not present

## 2021-11-16 MED ORDER — DOXYCYCLINE HYCLATE 100 MG PO TABS: 100.0000 mg | ORAL_TABLET | Freq: Two times a day (BID) | ORAL | 0 refills | Status: DC

## 2021-11-16 MED ORDER — LEVALBUTEROL HCL 1.25 MG/0.5ML IN NEBU: 1.2500 mg | INHALATION_SOLUTION | RESPIRATORY_TRACT | 12 refills | Status: DC | PRN

## 2021-11-16 MED ORDER — PREDNISONE 20 MG PO TABS: 40.0000 mg | ORAL_TABLET | Freq: Every day | ORAL | 0 refills | Status: AC

## 2021-11-16 MED ORDER — LEVALBUTEROL TARTRATE 45 MCG/ACT IN AERO: 2.0000 | INHALATION_SPRAY | Freq: Four times a day (QID) | RESPIRATORY_TRACT | 12 refills | Status: DC | PRN

## 2021-11-16 NOTE — Progress Notes (Addendum)
@Patient  ID: , female    DOB: [birth year], 61 y.o.   MRN: [id number]  Chief Complaint  Patient presents with   Follow-up    Pt is here for follow up fr pulmonary emphysema. Pt states the last few days she has gotten a cold or something is making her have increased SOB and her oxygen is dropping. She is on 3L of oxygen. Needs new chest xray possibly.     Referring provider: [id number], NP  HPI:   61 y.o. woman whom we are seeing in follow up for evaluation of COPD and chronic hypoxemic respiratory failure.  Cardiology note x3 reviewed.  Overall, have been doing okay.  Over the last 2 to 3 days developed chills, chest congestion minimal cough but not really productive, significant shortness of breath.  Endorses some wheezing.  Denies any sick contacts.  He had a friend visit from 48 recently.  She flew.  Was not sick at the time, no symptoms.  She has had a chronic cough present for many weeks.  But nothing acute.  Travel.  No long car rides or plane rides.  Did have lithotripsy 11/01/2021.  Think this is low risk in terms of risk of PE.  Discussed at length likely viral illness as she has chest congestion, cough, shortness of breath with wheeze.  Will trial treatment of COPD exacerbation if not improving, days will reevaluate.  We discussed her right heart catheterization in 2017, I cannot view these results at time of last visit.  This does confirm pulmonary hypertension with mean PA of 30 with LVEDP of 23 and PVR less than 2, group 2 disease big contributor with possible contribution of group 3 disease in her hypoxemia.  Discussed unlikely to benefit from treatment for pulmonary hypertension with pulmonary vasodilators.  HPI at initial visit: Patient was in normal state of health in 2017.  She went for surgery for kidney stones.  When she was awakened from anesthesia she had hypoxemia.  She is placed on oxygen.  Has been on oxygen ever since.  Often with dyspnea started  at that time.  Not really dyspneic prior to that.  Did not know had an issue with oxygen.  She uses 3 L with exertion.  This is been relatively stable over the last few years.  She quit smoking in 2010.  She has a approximately 40 pack year smoking history prior to that.  Reviewed her most recent PFTs in 2022 that demonstrates mild to moderate fixed obstruction, moderate reduced DLCO.  Reviewed most recent cross-sectional imaging CT scan 2017 that demonstrated severe emphysema per report.  Reviewed recent CT kidney stone scan that does show signs of emphysematous changes in the lower lobes albeit mild.  Reviewed most recent echocardiogram 02/2021 that does not demonstrate or discuss any right ventricular or right-sided abnormality.  Reviewed most recent echocardiogram in 03/2021 03/2019 that revealed normal RV size and function, normal RA size, normal RA pressure, no significant tricuspid regurgitation to suggest pulmonary hypertension.  She uses Trelegy and albuterol as needed.  She thinks they help a little bit.  She is not sure albuterol makes her feel any better, does give her the shakes.  She is currently enrolled in lung cancer screening with upcoming scan next week.  She denies any history of exacerbations of her underlying breathing or COPD requiring prednisone or hospitalization.  PMH: COPD, kidney stones Surgical history: Multiple lithotripsy, ureteral stent Family history: Mother history of pneumonia, no significant rest or  illness in first relatives Social history: Former smoker, 40-pack-year, quit in 2010, [place] [address] [place] as husband got a new job at [employer] warehouse  Questionaires / Pulmonary Flowsheets:   ACT:      No data to display           MMRC:     No data to display           Epworth:      No data to display           Tests:   FENO:  No results found for: "NITRICOXIDE"  PFT:     No data  to display           WALK:      No data to display           Imaging: Personally reviewed and as per EMR discussion this note DG C-Arm 1-60 Min-No Report  Result Date: 11/01/2021 Fluoroscopy was utilized by the requesting physician.  No radiographic interpretation.    Lab Results: Personally reviewed CBC    Component Value Date/Time   WBC 11.3 (H) 11/01/2021 0636   RBC 4.43 11/01/2021 0636   HGB 11.8 (L) 11/01/2021 0636   HCT 37.2 11/01/2021 0636   PLT 381 11/01/2021 0636   MCV 84.0 11/01/2021 0636   MCH 26.6 11/01/2021 0636   MCHC 31.7 11/01/2021 0636   RDW 14.3 11/01/2021 0636   LYMPHSABS 2.4 05/08/2021 1504   MONOABS 0.9 05/08/2021 1504   EOSABS 0.2 05/08/2021 1504   BASOSABS 0.1 05/08/2021 1504    BMET    Component Value Date/Time   NA 137 11/01/2021 0636   K 2.8 (L) 11/01/2021 0636   CL 101 11/01/2021 0636   CO2 25 11/01/2021 0636   GLUCOSE 119 (H) 11/01/2021 0636   BUN 14 11/01/2021 0636   CREATININE 1.09 (H) 11/01/2021 0636   CALCIUM 9.2 11/01/2021 0636   GFRNONAA 59 (L) 11/01/2021 0636    BNP No results found for: "BNP"  ProBNP No results found for: "PROBNP"  Specialty Problems   None   Allergies  Allergen Reactions   Penicillins Itching   Cephalexin Other (See Comments)    Upset stomach     There is no immunization history on file for this patient.  Past Medical History:  Diagnosis Date   Abdominal aortic atherosclerosis (HCC)    CHF (congestive heart failure) (HCC)    Complication of anesthesia 2017   problems with O2 sats   COPD (chronic obstructive pulmonary disease) (HCC)    Dyslipidemia    History of kidney stones    Hypertension    Kidney stones    Oxygen dependent    Pneumonia     Tobacco History: Social History   Tobacco Use  Smoking Status Former   Years: 30.00   Types: Cigarettes   Quit date: 2010   Years since quitting: 13.8  Smokeless Tobacco Never   Counseling given: Not Answered   Continue  to not smoke  Outpatient Encounter Medications as of 11/16/2021  Medication Sig   albuterol (VENTOLIN HFA) 108 (90 Base) MCG/ACT inhaler Inhale 1-2 puffs into the lungs every 6 (six) hours as needed for shortness of breath or wheezing.   aspirin EC 81 MG tablet Take 81 mg by mouth in the morning and at bedtime. Swallow whole.   atorvastatin (LIPITOR) 40 MG tablet Take 40 mg by mouth daily.   clobetasol ointment (TEMOVATE) 0.05 %  Apply 1 Application topically daily as needed (psoriasis).   doxycycline (VIBRA-TABS) 100 MG tablet Take 1 tablet (100 mg total) by mouth 2 (two) times daily.   ezetimibe (ZETIA) 10 MG tablet Take 10 mg by mouth daily.   fluticasone (FLONASE) 50 MCG/ACT nasal spray Place 1 spray into both nostrils daily as needed (congestion).   Fluticasone-Umeclidin-Vilant (TRELEGY ELLIPTA) 100-62.5-25 MCG/ACT AEPB Inhale 1 puff into the lungs daily.   HYDROcodone-acetaminophen (NORCO/VICODIN) 5-325 MG tablet Take 1 tablet by mouth every 4 (four) hours as needed.   hydrOXYzine (ATARAX) 10 MG tablet Take 10-20 tablets by mouth at bedtime as needed for itching.   ketorolac (TORADOL) 10 MG tablet Take 10 mg by mouth 3 (three) times daily as needed for moderate pain.   levalbuterol (XOPENEX HFA) 45 MCG/ACT inhaler Inhale 2 puffs into the lungs every 6 (six) hours as needed for wheezing.   levalbuterol (XOPENEX) 1.25 MG/0.5ML nebulizer solution Take 1.25 mg by nebulization every 4 (four) hours as needed for wheezing or shortness of breath.   losartan (COZAAR) 25 MG tablet TAKE ONE TABLET BY MOUTH ONE TIME DAILY AT 10PM   mupirocin ointment (BACTROBAN) 2 % Apply 1 application  topically daily as needed (psoriasis on feet).   ondansetron (ZOFRAN-ODT) 8 MG disintegrating tablet Take 8 mg by mouth every 6 (six) hours as needed for vomiting or nausea.   OTEZLA 30 MG TABS Take 30 mg by mouth 2 (two) times daily. Apremilast   OXYGEN Inhale 2.5 L into the lungs continuous.   predniSONE (DELTASONE)  20 MG tablet Take 2 tablets (40 mg total) by mouth daily with breakfast for 5 days.   roflumilast (DALIRESP) 500 MCG TABS tablet Take 1 tablet (500 mcg total) by mouth daily.   venlafaxine XR (EFFEXOR-XR) 150 MG 24 hr capsule Take 150 mg by mouth daily.   [DISCONTINUED] tamsulosin (FLOMAX) 0.4 MG CAPS capsule TAKE ONE CAPSULE BY MOUTH ONE TIME DAILY AFTER SUPPER   No facility-administered encounter medications on file as of 11/16/2021.     Review of Systems  Review of Systems  N/a Physical Exam  BP 124/64 (BP Location: Right Arm, Patient Position: Sitting, Cuff Size: Normal)   Pulse 83   Wt 197 lb 1.6 oz (89.4 kg)   SpO2 97%   BMI 34.91 kg/m   Wt Readings from Last 5 Encounters:  11/16/21 197 lb 1.6 oz (89.4 kg)  11/08/21 191 lb 6.4 oz (86.8 kg)  11/01/21 200 lb (90.7 kg)  10/08/21 200 lb (90.7 kg)  10/04/21 200 lb (90.7 kg)    BMI Readings from Last 5 Encounters:  11/16/21 34.91 kg/m  11/08/21 33.90 kg/m  11/01/21 35.43 kg/m  10/08/21 35.43 kg/m  10/04/21 35.43 kg/m     Physical Exam General: Well-appearing, sitting in a chair Eyes: EOMI, icterus Neck: Supple, no JVP appreciated sitting upright Pulmonary: Clear, normal work of breathing Cardiovascular: Warm, no edema Abdomen: Nondistended, bowel sounds present MSK: No synovitis, joint effusion Neuro: Normal gait, no weakness Psych: Normal mood, full affect   Assessment & Plan:    COPD with acute exacerbation: Gold B based on symptoms, does not exacerbate. Benefiting from roflumilast based on lack of exacerbations. Quit smoking 2010 after 40-pack-year history.  Emphysema on CT scans.  Continue Trelegy, albuterol as needed.  New prescription for levalbuterol nebulizer as well as HFA given jitteriness, shakiness with albuterol.  Worsening shortness of breath reported wheeze, chest congestion.  Chest x-ray today.  Prednisone 40 mg for 5 days and  doxycycline 100 mg twice daily for 7 days prescribed.  Chronic  hypoxemic respiratory failure: In the setting of COPD, emphysema.  Oxygen saturation adequate on supplemental oxygen.  Pulmonary hypertension: Right heart catheterization in 2017 with mean PA 30, LVEDP 23, cardiac output 4.6, RA 16, PVR less than 2.  Likely primarily driven by group 2 disease given these findings.  Recommend adequate diuresis at the discretion of her cardiologist.  Possible group 3 disease is contributing as well, no ILD to target with inhaled therapies.  Return in about 3 months (around 02/16/2022).   Karren Burly, MD 11/16/2021   I spent 41 minutes in the care of the patient including face-to-face visit, coordination of care, review of records.

## 2021-11-16 NOTE — Patient Instructions (Addendum)
Nice to see you again  Per the recent shortness of breath and congestion and chills I worry about a viral infection that could be triggering a flare of the emphysema.  Take prednisone 40 mg once a day for 5 days and doxycycline 100 mg twice a day for 7 days  I hope you start improving in the next 24 to 48 hours.  If things are not improving or getting worse please contact us by Friday for further instructions.  I sent a new prescription for levalbuterol inhaler and nebulizer to be used as needed as a rescue.  This is very similar to albuterol but usually has less effect of jitteriness or heart racing.  I put in an order for pulmonary rehab, there is a few week wait so likely this will happen in the next few weeks.  Ideally would get you feeling better prior to starting.  Return to clinic in 3 months with Dr. Judeth Horn or sooner as needed,

## 2021-11-17 ENCOUNTER — Telehealth (HOSPITAL_COMMUNITY): Payer: Self-pay

## 2021-11-17 NOTE — Telephone Encounter (Signed)
Contacted pt insurance, and pt insurance does not cover pulmonary rehab. Ref# [id number]. I advised pt of this information, she stated that she will talk to her doctor about it. Closed referral.

## 2021-11-18 ENCOUNTER — Telehealth: Payer: Self-pay | Admitting: Pulmonary Disease

## 2021-11-18 ENCOUNTER — Other Ambulatory Visit: Payer: Self-pay

## 2021-11-18 MED ORDER — ALBUTEROL SULFATE HFA 108 (90 BASE) MCG/ACT IN AERS: 1.0000 | INHALATION_SPRAY | Freq: Four times a day (QID) | RESPIRATORY_TRACT | 6 refills | Status: DC | PRN

## 2021-11-18 NOTE — Telephone Encounter (Signed)
Patient is returning a call regarding test results.  Please call patient back at [phone]

## 2021-11-18 NOTE — Telephone Encounter (Signed)
Called and spoke with patient about her chest xray results. Nothing further needed

## 2021-11-18 NOTE — Progress Notes (Signed)
CXR shows chronic signs of bronchitis and emphysema - but no acute changes or pneumonia

## 2021-11-19 ENCOUNTER — Other Ambulatory Visit: Payer: Self-pay | Admitting: Pulmonary Disease

## 2021-12-01 ENCOUNTER — Telehealth: Payer: Self-pay

## 2021-12-01 NOTE — Telephone Encounter (Signed)
PA request received via CMM for Roflumilast tablets through [facility].   PA has been submitted and is awaiting determination.   [place] Levo - PA Case ID: [id number]

## 2021-12-02 NOTE — Telephone Encounter (Signed)
Will addend OV note

## 2021-12-02 NOTE — Telephone Encounter (Signed)
Dr Judeth Horn,  Please note on message from pharmacy team:  PA for Roflumilast has been DENIED due to :Per your health plan's criteria, this drug is covered if you meet the following: (1) Your doctor tells [place] that this treatment is working for your condition. The information provided does not show that you meet the criteria listed above.   Please add documentation to patients chart if this medication has helped in improving condition.   Thank you sir

## 2021-12-02 NOTE — Telephone Encounter (Signed)
PA for Roflumilast has been DENIED due to :Per your health plan's criteria, this drug is covered if you meet the following: (1) Your doctor tells [place] that this treatment is working for your condition. The information provided does not show that you meet the criteria listed above.  Please add documentation to patients chart if this medication has helped in improving condition.

## 2021-12-02 NOTE — Telephone Encounter (Signed)
OK can discuss at next OV

## 2022-01-11 ENCOUNTER — Other Ambulatory Visit: Payer: Self-pay | Admitting: Cardiology

## 2022-01-11 DIAGNOSIS — I10 Essential (primary) hypertension: Secondary | ICD-10-CM

## 2022-01-19 ENCOUNTER — Other Ambulatory Visit: Payer: Self-pay | Admitting: Urology

## 2022-01-19 ENCOUNTER — Encounter (HOSPITAL_BASED_OUTPATIENT_CLINIC_OR_DEPARTMENT_OTHER): Payer: Self-pay | Admitting: Urology

## 2022-01-19 NOTE — Progress Notes (Signed)
Spoke with pt about upcoming ESWL procedure 01/21/22. Instructed to arrive at 0645 to [facility], location reviewed. NPO past midnight. Reviewed medications, history, and allergies. Pt has been off ASA for several months, discussed no ASA or NSAID products per protocol. Pt to bring inhalers day of. Pt on continuous home O2 at 2-3L - to bring home tank per Amy at [facility]. Reviewed pre-op instructions with pt. Instructed to bring blue folder, insurance and ID card. Husband to be driver. Pt verbalized understanding and states no further questions.

## 2022-01-21 ENCOUNTER — Ambulatory Visit (HOSPITAL_COMMUNITY): Payer: BC Managed Care – PPO

## 2022-01-21 ENCOUNTER — Encounter (HOSPITAL_BASED_OUTPATIENT_CLINIC_OR_DEPARTMENT_OTHER): Payer: Self-pay | Admitting: Urology

## 2022-01-21 ENCOUNTER — Other Ambulatory Visit: Payer: Self-pay

## 2022-01-21 ENCOUNTER — Encounter (HOSPITAL_BASED_OUTPATIENT_CLINIC_OR_DEPARTMENT_OTHER)
Admission: RE | Disposition: A | Discharge status: Home / Self Care | Payer: Self-pay | Source: Home / Self Care | Attending: Urology

## 2022-01-21 ENCOUNTER — Ambulatory Visit (HOSPITAL_BASED_OUTPATIENT_CLINIC_OR_DEPARTMENT_OTHER)
Admission: RE | Admit: 2022-01-21 | Discharge: 2022-01-21 | Disposition: A | Discharge status: Home / Self Care | Payer: BC Managed Care – PPO | Attending: Urology | Admitting: Urology

## 2022-01-21 DIAGNOSIS — N201 Calculus of ureter: Secondary | ICD-10-CM | POA: Diagnosis not present

## 2022-01-21 DIAGNOSIS — Z539 Procedure and treatment not carried out, unspecified reason: Secondary | ICD-10-CM | POA: Insufficient documentation

## 2022-01-21 HISTORY — PX: EXTRACORPOREAL SHOCK WAVE LITHOTRIPSY: SHX1557

## 2022-01-21 SURGERY — LITHOTRIPSY, ESWL
Anesthesia: LOCAL | Laterality: Left

## 2022-01-21 MED ORDER — CIPROFLOXACIN HCL 500 MG PO TABS
ORAL_TABLET | ORAL | Status: AC
  Filled 2022-01-21: qty 1

## 2022-01-21 MED ORDER — SODIUM CHLORIDE 0.9 % IV SOLN: INTRAVENOUS | Status: DC

## 2022-01-21 MED ORDER — DIPHENHYDRAMINE HCL 25 MG PO CAPS
ORAL_CAPSULE | ORAL | Status: AC
  Filled 2022-01-21: qty 1

## 2022-01-21 MED ORDER — DIPHENHYDRAMINE HCL 25 MG PO CAPS
25.0000 mg | ORAL_CAPSULE | ORAL | Status: AC
  Administered 2022-01-21: 25 mg via ORAL

## 2022-01-21 MED ORDER — DIAZEPAM 5 MG PO TABS
ORAL_TABLET | ORAL | Status: AC
  Filled 2022-01-21: qty 2

## 2022-01-21 MED ORDER — DIAZEPAM 5 MG PO TABS
10.0000 mg | ORAL_TABLET | ORAL | Status: AC
  Administered 2022-01-21: 10 mg via ORAL

## 2022-01-21 MED ORDER — CIPROFLOXACIN HCL 500 MG PO TABS
500.0000 mg | ORAL_TABLET | ORAL | Status: AC
  Administered 2022-01-21: 500 mg via ORAL

## 2022-01-24 ENCOUNTER — Encounter (HOSPITAL_BASED_OUTPATIENT_CLINIC_OR_DEPARTMENT_OTHER): Payer: Self-pay | Admitting: Urology

## 2022-01-24 ENCOUNTER — Other Ambulatory Visit: Payer: Self-pay | Admitting: Urology

## 2022-01-24 NOTE — Progress Notes (Signed)
Could not reach patient.  I was able to speak with spouse on the phone.  He said that he would relay the message to hold the dose of Aspirin in the morning. I will call patient back tomorrow and relay the rest of the discharge instructions.

## 2022-01-25 NOTE — Progress Notes (Signed)
Contact made with patient.  She has not started taking Aspirin back. Is aware to be NPO after midnight.  Pt aware instructions prior to her arriving. Arrive at [employer].

## 2022-01-28 ENCOUNTER — Ambulatory Visit (HOSPITAL_BASED_OUTPATIENT_CLINIC_OR_DEPARTMENT_OTHER): Payer: BC Managed Care – PPO | Admitting: Certified Registered"

## 2022-01-28 ENCOUNTER — Other Ambulatory Visit: Payer: Self-pay

## 2022-01-28 ENCOUNTER — Encounter (HOSPITAL_BASED_OUTPATIENT_CLINIC_OR_DEPARTMENT_OTHER)
Admission: RE | Disposition: A | Discharge status: Home / Self Care | Payer: Self-pay | Source: Home / Self Care | Attending: Urology

## 2022-01-28 ENCOUNTER — Ambulatory Visit (HOSPITAL_BASED_OUTPATIENT_CLINIC_OR_DEPARTMENT_OTHER)
Admission: RE | Admit: 2022-01-28 | Discharge: 2022-01-28 | Disposition: A | Discharge status: Home / Self Care | Payer: BC Managed Care – PPO | Attending: Urology | Admitting: Urology

## 2022-01-28 ENCOUNTER — Ambulatory Visit (HOSPITAL_COMMUNITY): Payer: BC Managed Care – PPO

## 2022-01-28 ENCOUNTER — Encounter (HOSPITAL_BASED_OUTPATIENT_CLINIC_OR_DEPARTMENT_OTHER): Payer: Self-pay | Admitting: Urology

## 2022-01-28 DIAGNOSIS — I509 Heart failure, unspecified: Secondary | ICD-10-CM | POA: Diagnosis not present

## 2022-01-28 DIAGNOSIS — I110 Hypertensive heart disease with heart failure: Secondary | ICD-10-CM | POA: Insufficient documentation

## 2022-01-28 DIAGNOSIS — Z9981 Dependence on supplemental oxygen: Secondary | ICD-10-CM | POA: Diagnosis not present

## 2022-01-28 DIAGNOSIS — N132 Hydronephrosis with renal and ureteral calculous obstruction: Secondary | ICD-10-CM | POA: Insufficient documentation

## 2022-01-28 DIAGNOSIS — Z87442 Personal history of urinary calculi: Secondary | ICD-10-CM | POA: Diagnosis not present

## 2022-01-28 DIAGNOSIS — J449 Chronic obstructive pulmonary disease, unspecified: Secondary | ICD-10-CM | POA: Insufficient documentation

## 2022-01-28 DIAGNOSIS — Z87891 Personal history of nicotine dependence: Secondary | ICD-10-CM | POA: Diagnosis not present

## 2022-01-28 HISTORY — PX: EXTRACORPOREAL SHOCK WAVE LITHOTRIPSY: SHX1557

## 2022-01-28 SURGERY — LITHOTRIPSY, ESWL
Anesthesia: Monitor Anesthesia Care | Laterality: Left

## 2022-01-28 MED ORDER — PROPOFOL 1000 MG/100ML IV EMUL
INTRAVENOUS | Status: AC
  Filled 2022-01-28: qty 100

## 2022-01-28 MED ORDER — ACETAMINOPHEN 500 MG PO TABS
1000.0000 mg | ORAL_TABLET | Freq: Once | ORAL | Status: AC
  Administered 2022-01-28: 1000 mg via ORAL

## 2022-01-28 MED ORDER — ACETAMINOPHEN 500 MG PO TABS
ORAL_TABLET | ORAL | Status: AC
  Filled 2022-01-28: qty 2

## 2022-01-28 MED ORDER — ONDANSETRON HCL 4 MG/2ML IJ SOLN
INTRAMUSCULAR | Status: AC
  Filled 2022-01-28: qty 2

## 2022-01-28 MED ORDER — CIPROFLOXACIN HCL 500 MG PO TABS
ORAL_TABLET | ORAL | Status: AC
  Filled 2022-01-28: qty 1

## 2022-01-28 MED ORDER — FENTANYL CITRATE (PF) 100 MCG/2ML IJ SOLN
INTRAMUSCULAR | Status: AC
  Filled 2022-01-28: qty 2

## 2022-01-28 MED ORDER — MIDAZOLAM HCL 2 MG/2ML IJ SOLN
INTRAMUSCULAR | Status: AC
  Filled 2022-01-28: qty 2

## 2022-01-28 MED ORDER — SODIUM CHLORIDE 0.9 % IV SOLN: INTRAVENOUS | Status: DC

## 2022-01-28 MED ORDER — CIPROFLOXACIN HCL 500 MG PO TABS
500.0000 mg | ORAL_TABLET | ORAL | Status: AC
  Administered 2022-01-28: 500 mg via ORAL

## 2022-01-28 MED ORDER — LIDOCAINE HCL (PF) 2 % IJ SOLN
INTRAMUSCULAR | Status: AC
  Filled 2022-01-28: qty 5

## 2022-01-28 NOTE — Anesthesia Postprocedure Evaluation (Signed)
Anesthesia Post Note  Patient: Mckenzie Sullivan  Procedure(s) Performed: LEFT EXTRACORPOREAL SHOCK WAVE LITHOTRIPSY (ESWL) (Left)     Patient location during evaluation: PACU Anesthesia Type: MAC Level of consciousness: awake and alert Pain management: pain level controlled Vital Signs Assessment: post-procedure vital signs reviewed and stable Respiratory status: spontaneous breathing, nonlabored ventilation and respiratory function stable Cardiovascular status: stable and blood pressure returned to baseline Anesthetic complications: no   No notable events documented.  Last Vitals:  Vitals:   01/28/22 0837 01/28/22 1030  BP: (!) 152/73 134/79  Pulse: 86 67  Resp: 18 16  Temp: 36.6 C 36.6 C  SpO2: 98% 96%    Last Pain:  Vitals:   01/28/22 0837  TempSrc: Oral                 Audry Pili

## 2022-01-28 NOTE — Addendum Note (Signed)
Addendum  created 01/28/22 1343 by Justice Rocher, CRNA   Intraprocedure Staff edited

## 2022-01-28 NOTE — Discharge Instructions (Addendum)
See [facility] discharge instructions in chart.  

## 2022-01-28 NOTE — Transfer of Care (Signed)
Immediate Anesthesia Transfer of Care Note  Patient: Mckenzie Sullivan  Procedure(s) Performed: Procedure(s) (LRB): LEFT EXTRACORPOREAL SHOCK WAVE LITHOTRIPSY (ESWL) (Left)  Patient Location: PACU  Anesthesia Type: MAC - monitred only   Level of Consciousness: awake, patient cooperative and responds to stimulation  Airway & Oxygen Therapy: Patient Spontanous Breathing and Patient connected to [place] oxygen at 2 Liters/ min   Post-op Assessment: Report given to PACU RN, Post -op Vital signs reviewed and stable and Patient moving all extremities  Post vital signs: Reviewed and stable. No medications given monitored only  Complications: No apparent anesthesia complications

## 2022-01-28 NOTE — Progress Notes (Signed)
ESWL was not completed due to inability to visualize stone.  Isovue was administered and no calculus seen. Patient procedure was cancelled and she was discharged home.

## 2022-01-28 NOTE — H&P (Signed)
CC/HPI: CC: Left ureteral calculus  HPI:  03/09/2021  62 year old female with an obstructing 7 mm proximal left ureteral calculus with severe upstream hydroureteronephrosis. She went to the emergency department today where CT scan revealed this. She denies any fever, chill, nausea, vomiting. Pain is currently controlled. She has had several stones in the past for which she had multiple ureteroscopy's. She is on supplemental oxygen with a stated history of CHF, COPD. She recently moved here from [place]. Cardiologist is in [facility] and she has not establish care with another one here. She is on 81 mg aspirin twice a day. Otherwise denies any blood thinner. She has never had lithotripsy.   04/01/2021: 62 year old female who presents today after undergoing left-sided lithotripsy. She did very well this operatively and passed multiple fragments. She brings these with her today for analysis. She denies any fevers or chills or gross hematuria. She denies pain today.   09/28/2021: Mckenzie Sullivan is a 62 year old female with a past medical history of nephrolithiasis, CHF, COPD. She is on supplemental oxygen. She underwent a lithotripsy for left-sided ureteral stone back in March of this year. She did well with this procedure and passed multiple fragments. Follow-up imaging was clear. She presents today for a 75-month follow-up and renal ultrasound. Unfortunately, on ultrasound she has significant left-sided hydronephrosis. She reports that her pain began about a week ago and she has been dealing with it at home with the use of ibuprofen and acetaminophen. We performed KUB today which did show some opacities within the anatomical location of the renal pelvis on the left side. However I was concerned that there may be more than 1 calculus and therefore advised a CT scan. CT imaging shows 4 opacities within the left ureter with severe hydronephrosis.   10/18/2021: 62 year old female who underwent a left sided ureteroscopy  due to hydroureter nephrosis from multiple stone fragments presents today for follow-up. Unfortunately, per the operative note, she had severe ureteral edema and there was difficulty performing ureteroscopy. Therefore, a stent was placed and she will need to be scheduled for restaging ureteroscopy in the next few weeks. She was unaware she had a stent and has been tolerating it well. She does report some crampiness. She denies fevers but endorses some chills. Chills have resolved. She has not heard from our scheduling department yet.   12/31/2021: [name] is a 62 year old female who presents today for follow-up after she underwent left-sided ureteroscopy with a successful left-sided stent removal. She denies left-sided flank pain but is endorsing some right-sided hip and leg pain. She denies fevers and chills. She was having cloudy urine but reports today it is clear. Her renal ultrasound shows moderate to severe left hydronephrosis.   01/04/2022: 62 year old female who presents today for follow-up regarding a left-sided mid ureteral stone causing mild hydronephrosis. She is not having any pain. She denies fevers and chills. She recently underwent a left-sided ureteroscopy with successful left stent removal and today was her 6-week follow-up for renal ultrasound which showed continued hydronephrosis. CT imaging was performed which shows a 3 mm stone in the mid to distal left ureter. She has no fevers or chills associated with this.   01/18/2022: Mckenzie Sullivan is a pleasant 62 year old female who presents today for follow-up regarding a distal left-sided ureteral stone causing mild hydronephrosis. She has been trialing medical expulsive therapy and has not seen a stone pass. She continues to experience intermittent pain and discomfort. She denies fevers or chills. She is oxygen dependent and has had  issues general anesthesia in the past. She is hoping to avoid surgery.     ALLERGIES: penicillin - Hives     MEDICATIONS: Tamsulosin Hcl 0.4 mg capsule 1 capsule PO Q HS     Notes: Unknown and not listed in epic. The states she is on aspirin 81 mg but no other blood thinners.   GU PSH: Cysto Remove Stent FB Sim - 11/09/2021 Cystoscopy Insert Stent - 11/01/2021 ESWL - 03/18/2021, 2017 Ureteroscopic stone removal - 11/01/2021       PSH Notes: She has had ureteroscopy x3 but not ESWL   NON-GU PSH: No Non-GU PSH    GU PMH: Hydronephrosis - 01/04/2022, - 12/31/2021 Ureteral obstruction secondary to calculous - 01/04/2022, - 12/31/2021, - 10/18/2021, - 09/28/2021, - 03/09/2021 Ureteral calculus - 11/09/2021, - 10/18/2021, - 09/28/2021, - 04/01/2021, - 03/09/2021      PMH Notes: COPD, CHF   NON-GU PMH: Arthritis Depression Diabetes Type 2 Hypercholesterolemia    FAMILY HISTORY: No Family History    SOCIAL HISTORY: Marital Status: Married Preferred Language: English; Race: White Current Smoking Status: Patient does not smoke anymore. Has not smoked since 03/03/2008. Smoked for 30 years.   Tobacco Use Assessment Completed: Used Tobacco in last 30 days? Has never drank.  Drinks 1 caffeinated drink per day. Has not had a blood transfusion.    REVIEW OF SYSTEMS:    GU Review Female:   Patient reports frequent urination. Patient denies hard to postpone urination, burning /pain with urination, get up at night to urinate, leakage of urine, stream starts and stops, trouble starting your stream, have to strain to urinate, and being pregnant.  Gastrointestinal (Upper):   Patient denies nausea, vomiting, and indigestion/ heartburn.  Gastrointestinal (Lower):   Patient denies diarrhea and constipation.  Constitutional:   Patient denies fever, night sweats, weight loss, and fatigue.  Skin:   Patient denies itching and skin rash/ lesion.  Respiratory:   Patient denies cough and shortness of breath.  Musculoskeletal:   Patient denies back pain and joint pain.  Neurological:   Patient denies headaches and  dizziness.  Psychologic:   Patient denies depression and anxiety.   Notes: Patient here to follow up on kidney stone that she think she haven't passed yet.    VITAL SIGNS:      01/18/2022 02:41 PM  BP 148/69 mmHg  Pulse 102 /min  Temperature 97.9 F / 36.6 C   MULTI-SYSTEM PHYSICAL EXAMINATION:    Constitutional: Well-nourished. No physical deformities. Normally developed. Good grooming.   Cardiovascular: Normal temperature, normal extremity pulses, no swelling, no varicosities.   Neurologic / Psychiatric: Oriented to time, oriented to place, oriented to person. No depression, no anxiety, no agitation.    Notes: Dependent on oxygen.      PAST DATA REVIEW: None   PROCEDURES:         C.T. Urogram - [id number]      Patient confirmed No Neulasta OnPro Device.          KUB - [id number]  A single view of the abdomen is obtained. There is a opacity that measures the same size and shape of opacities on CT imaging near the SI joint at the level of the sacrum behind the pelvic bone. Stable pelvic phleboliths noted with an inlet of the pelvis.      . Patient confirmed No Neulasta OnPro Device.           Urinalysis w/Scope Dipstick Dipstick Cont'd Micro  Color: Yellow Bilirubin: Neg  mg/dL WBC/hpf: 0 - 5/hpf  Appearance: Clear Ketones: Neg mg/dL RBC/hpf: 3 - 10/hpf  Specific Gravity: 1.015 Blood: 2+ ery/uL Bacteria: Few (10-25/hpf)  pH: 7.5 Protein: Neg mg/dL Cystals: Amorph Phosphates  Glucose: Neg mg/dL Urobilinogen: 0.2 mg/dL Casts: NS (Not Seen)    Nitrites: Neg Trichomonas: Not Present    Leukocyte Esterase: Neg leu/uL Mucous: Not Present      Epithelial Cells: 0 - 5/hpf      Yeast: NS (Not Seen)      Sperm: Not Present    Notes: Renal Tubular epithelium present    ASSESSMENT:      ICD-10 Details  1 GU:   Ureteral obstruction secondary to calculous - N13.2 Left, Acute, Uncomplicated  2   Ureteral calculus - N20.1 Left, Acute, Uncomplicated   PLAN:           Orders Labs  CULTURE, URINE  X-Rays: KUB    C.T. Stone Protocol Without I.V. Contrast  X-Ray Notes: History:  Hematuria: Yes/No  Patient to see MD after exam: Yes/No  Previous exam: CT / IVP/ US/ KUB/ None  When:  Where:  Diabetic: Yes/ No  BUN/ Creatine:  Date of last BUN Creatinine:  Weight in pounds:  Allergy- Contrasts/ Shellfish: Yes/ No  Conflicting diabetic meds: Yes/ No  Oral contrast and instructions given to patient:   Prior Authorization #Dillon Bjork #[id number] Valid 01/18/22-02/17/22            Schedule         Document Letter(s):  Created for Patient: Clinical Summary         Notes:   1. Hydronephrosis secondary to obstructing calculus: CT imaging with a 5 mm left distal ureteral stone causing moderate to severe hydronephrosis. I advised KUB imaging for definitive stone intervention because she would like to avoid being put to sleep again. On this KUB I was able to visualize previously noted stone at the level of the sacrum next to the left SI joint. Urinalysis will be sent for precautionary culture today. Stone intervention was discussed in detail today. For ureteroscopy, the patient understands that there is a chance for a staged procedure. Patient also understands that there is risk for bleeding, infection, injury to surrounding organs, and general risks of anesthesia. The patient also understands the placement of a stent and the risks of stent placement including, risk for infection, the risk for pain, and the risk for injury. For ESWL, the patient understands that there is a chance of failure of procedure, there is also a risk for bruising, infection, bleeding, and injury to surrounding structures. The patient verbalized understanding to these risks.

## 2022-01-28 NOTE — Anesthesia Preprocedure Evaluation (Addendum)
Anesthesia Evaluation  Patient identified by MRN, date of birth, ID band Patient awake    Reviewed: Allergy & Precautions, NPO status , Patient's Chart, lab work & pertinent test results  History of Anesthesia Complications Negative for: history of anesthetic complications  Airway Mallampati: II  TM Distance: >3 FB Neck ROM: Full    Dental  (+) Dental Advisory Given, Partial Upper, Chipped   Pulmonary COPD,  oxygen dependent, former smoker   Pulmonary exam normal        Cardiovascular hypertension, Pt. on medications Normal cardiovascular exam   '23 TTE - EF 65%, mild AI    Neuro/Psych negative neurological ROS  negative psych ROS   GI/Hepatic negative GI ROS, Neg liver ROS,,,  Endo/Other  negative endocrine ROS    Renal/GU      Musculoskeletal negative musculoskeletal ROS (+)    Abdominal   Peds  Hematology negative hematology ROS (+)   Anesthesia Other Findings   Reproductive/Obstetrics                             Anesthesia Physical Anesthesia Plan  ASA: 3  Anesthesia Plan: MAC   Post-op Pain Management: Tylenol PO (pre-op)*   Induction:   PONV Risk Score and Plan: 2 and Propofol infusion and Treatment may vary due to age or medical condition  Airway Management Planned: Natural Airway and Simple Face Mask  Additional Equipment: None  Intra-op Plan:   Post-operative Plan:   Informed Consent: I have reviewed the patients History and Physical, chart, labs and discussed the procedure including the risks, benefits and alternatives for the proposed anesthesia with the patient or authorized representative who has indicated his/her understanding and acceptance.       Plan Discussed with: CRNA and Anesthesiologist  Anesthesia Plan Comments:         Anesthesia Quick Evaluation

## 2022-01-31 ENCOUNTER — Encounter (HOSPITAL_BASED_OUTPATIENT_CLINIC_OR_DEPARTMENT_OTHER): Payer: Self-pay | Admitting: Urology

## 2022-03-03 ENCOUNTER — Encounter: Payer: Self-pay | Admitting: Pulmonary Disease

## 2022-03-03 ENCOUNTER — Ambulatory Visit: Payer: BC Managed Care – PPO | Admitting: Pulmonary Disease

## 2022-03-03 VITALS — BP 132/78 | HR 88 | Temp 98.6°F | Wt 196.8 lb

## 2022-03-03 DIAGNOSIS — J432 Centrilobular emphysema: Secondary | ICD-10-CM | POA: Diagnosis not present

## 2022-03-03 MED ORDER — TRELEGY ELLIPTA 100-62.5-25 MCG/ACT IN AEPB: 1.0000 | INHALATION_SPRAY | Freq: Every day | RESPIRATORY_TRACT | 6 refills | Status: DC

## 2022-03-03 NOTE — Progress Notes (Signed)
$'@Patient'R$  ID: Mckenzie Sullivan, female    DOB: April 26, 1960, 62 y.o.   MRN: [id number]  Chief Complaint  Patient presents with   Follow-up    Pt is here for follow up for DOE. Pt states that Levoalbuterol is helping her with her breathing and it does not make her as jumpy as the other medication. Pulm rehab was denied with her insurance. She states that prednisone did help her last time. Pt is still on 2L pulsed o2.     Referring provider: Ferd Hibbs, NP  HPI:   62 y.o. woman whom we are seeing in follow up for evaluation of COPD and chronic hypoxemic respiratory failure.    Has been relatively well in the interim.  Treated for exacerbation at last visit.  No exacerbations in the interim.  Good adherence to Trelegy.  Pulmonary rehab was denied.  Understands.  Discussed we should retry the new year.  She is amenable.  Roflumilast also denied.  She did get initial 90-day supply and is taking it.  No GI upset or side effect issues.  HPI at initial visit: Patient was in normal state of health in 2017.  She went for surgery for kidney stones.  When she was awakened from anesthesia she had hypoxemia.  She is placed on oxygen.  Has been on oxygen ever since.  Often with dyspnea started at that time.  Not really dyspneic prior to that.  Did not know had an issue with oxygen.  She uses 3 L with exertion.  This is been relatively stable over the last few years.  She quit smoking in 2010.  She has a approximately 40 pack year smoking history prior to that.  Reviewed her most recent PFTs in 2022 that demonstrates mild to moderate fixed obstruction, moderate reduced DLCO.  Reviewed most recent cross-sectional imaging CT scan 2017 that demonstrated severe emphysema per report.  Reviewed recent CT kidney stone scan that does show signs of emphysematous changes in the lower lobes albeit mild.  Reviewed most recent echocardiogram 02/2021 that does not demonstrate or discuss any right ventricular or right-sided  abnormality.  Reviewed most recent echocardiogram in [place] 03/2019 that revealed normal RV size and function, normal RA size, normal RA pressure, no significant tricuspid regurgitation to suggest pulmonary hypertension.  She uses Trelegy and albuterol as needed.  She thinks they help a little bit.  She is not sure albuterol makes her feel any better, does give her the shakes.  She is currently enrolled in lung cancer screening with upcoming scan next week.  She denies any history of exacerbations of her underlying breathing or COPD requiring prednisone or hospitalization.  PMH: COPD, kidney stones Surgical history: Multiple lithotripsy, ureteral stent Family history: Mother history of pneumonia, no significant rest or illness in first relatives Social history: Former smoker, 40-pack-year, quit in 2010, lives in [place], moved to [place] from [place] in 2023 as husband got a new job at [employer] / Pulmonary Flowsheets:   ACT:      No data to display           MMRC:     No data to display           Epworth:      No data to display           Tests:   FENO:  No results found for: "NITRICOXIDE"  PFT:     No data to display  WALK:      No data to display           Imaging: Personally reviewed and as per EMR discussion this note No results found.  Lab Results: Personally reviewed CBC    Component Value Date/Time   WBC 11.3 (H) 11/01/2021 0636   RBC 4.43 11/01/2021 0636   HGB 11.8 (L) 11/01/2021 0636   HCT 37.2 11/01/2021 0636   PLT 381 11/01/2021 0636   MCV 84.0 11/01/2021 0636   MCH 26.6 11/01/2021 0636   MCHC 31.7 11/01/2021 0636   RDW 14.3 11/01/2021 0636   LYMPHSABS 2.4 05/08/2021 1504   MONOABS 0.9 05/08/2021 1504   EOSABS 0.2 05/08/2021 1504   BASOSABS 0.1 05/08/2021 1504    BMET    Component Value Date/Time   NA 137 11/01/2021 0636   K 2.8 (L) 11/01/2021 0636   CL 101  11/01/2021 0636   CO2 25 11/01/2021 0636   GLUCOSE 119 (H) 11/01/2021 0636   BUN 14 11/01/2021 0636   CREATININE 1.09 (H) 11/01/2021 0636   CALCIUM 9.2 11/01/2021 0636   GFRNONAA 59 (L) 11/01/2021 0636    BNP No results found for: "BNP"  ProBNP No results found for: "PROBNP"  Specialty Problems   None   Allergies  Allergen Reactions   Penicillins Itching   Cephalexin Other (See Comments)    Upset stomach    Immunization History  Administered Date(s) Administered   Influenza-Unspecified 11/05/2021    Past Medical History:  Diagnosis Date   Abdominal aortic atherosclerosis (HCC)    CHF (congestive heart failure) ([place])    Complication of anesthesia 2017   problems with O2 sats   COPD (chronic obstructive pulmonary disease) (HCC)    Dyslipidemia    History of kidney stones    Hypertension    Kidney stones    Oxygen dependent    Pneumonia     Tobacco History: Social History   Tobacco Use  Smoking Status Former   Years: 30.00   Types: Cigarettes   Quit date: 2010   Years since quitting: 14.1  Smokeless Tobacco Never   Counseling given: Not Answered   Continue to not smoke  Outpatient Encounter Medications as of 03/03/2022  Medication Sig   estradiol (ESTRACE) 0.1 MG/GM vaginal cream Place vaginally.   Fluticasone-Umeclidin-Vilant (TRELEGY ELLIPTA) 100-62.5-25 MCG/ACT AEPB Inhale 1 puff into the lungs daily.   halobetasol (ULTRAVATE) 0.05 % cream Apply topically.   levalbuterol (XOPENEX HFA) 45 MCG/ACT inhaler Inhale into the lungs every 4 (four) hours as needed for wheezing.   meloxicam (MOBIC) 15 MG tablet Take 15 mg by mouth daily.   OTEZLA 30 MG TABS Take 1 tablet by mouth 2 (two) times daily.   tamsulosin (FLOMAX) 0.4 MG CAPS capsule Take 0.4 mg by mouth daily.   No facility-administered encounter medications on file as of 03/03/2022.     Review of Systems  Review of Systems  N/a Physical Exam  BP 132/78 (BP Location: Left Arm, Patient  Position: Sitting, Cuff Size: Normal)   Pulse 88   Temp 98.6 F (37 C) (Oral)   Wt 196 lb 12.8 oz (89.3 kg)   SpO2 98%   BMI 36.00 kg/m   Wt Readings from Last 5 Encounters:  03/03/22 196 lb 12.8 oz (89.3 kg)  01/28/22 195 lb 3.2 oz (88.5 kg)  01/21/22 194 lb 4.8 oz (88.1 kg)  11/16/21 197 lb 1.6 oz (89.4 kg)  11/08/21 191 lb 6.4 oz (86.8 kg)  BMI Readings from Last 5 Encounters:  03/03/22 36.00 kg/m  01/28/22 35.70 kg/m  01/21/22 35.54 kg/m  11/16/21 34.91 kg/m  11/08/21 33.90 kg/m     Physical Exam General: Well-appearing, sitting in a chair Eyes: EOMI, icterus Neck: Supple, no JVP appreciated sitting upright Pulmonary: Clear, normal work of breathing Cardiovascular: Warm, no edema Abdomen: Nondistended, bowel sounds present MSK: No synovitis, joint effusion Neuro: Normal gait, no weakness Psych: Normal mood, full affect   Assessment & Plan:    COPD: Gold B based on symptoms, rare exacerbations (last 11/2021). Benefiting from roflumilast based on lack of exacerbations. Quit smoking 2010 after 40-pack-year history.  Emphysema on CT scans.  Continue Trelegy, levalbuterol nebulizer as well as HFA as needed (avoiding albuterol given jitteriness, shakiness with albuterol).  Referral to pulmonary rehab placed today, I do not understand why it was denied in the past.  Chronic hypoxemic respiratory failure: In the setting of COPD, emphysema.  Oxygen saturation adequate on supplemental oxygen.  Pulmonary hypertension: Right heart catheterization in 2017 with mean PA 30, LVEDP 23, cardiac output 4.6, RA 16, PVR less than 2.  Likely primarily driven by group 2 disease given these findings.  Recommend adequate diuresis at the discretion of her cardiologist.  Possible group 3 disease is contributing as well, no ILD to target with inhaled therapies.  Return in about 6 months (around 09/01/2022).   Lanier Clam, MD 03/03/2022

## 2022-03-03 NOTE — Patient Instructions (Signed)
Nice to see you again  I am glad you are doing well  Continue Trelegy 1 puff daily  Okay to take the Roflumilast for now   If symptoms worsen or exacerbations come back while off the Roflumilast we can appeal to insurance to have it approved, I am sorry it was denied  I sent a new referral to pulmonary rehab, let's see if it gets approved in the new year  Return to clinic in 6 months or sooner as needed with Dr. Silas Flood

## 2022-03-12 ENCOUNTER — Telehealth (INDEPENDENT_AMBULATORY_CARE_PROVIDER_SITE_OTHER): Payer: BC Managed Care – PPO | Admitting: Student

## 2022-03-12 DIAGNOSIS — R059 Cough, unspecified: Secondary | ICD-10-CM

## 2022-03-12 MED ORDER — AZITHROMYCIN 250 MG PO TABS: ORAL_TABLET | ORAL | 0 refills | Status: DC

## 2022-03-12 MED ORDER — PREDNISONE 10 MG PO TABS: ORAL_TABLET | ORAL | 0 refills | Status: DC

## 2022-03-12 NOTE — Telephone Encounter (Signed)
Virtual Visit via Telephone Note   I connected with Mckenzie Sullivan today by a video enabled telemedicine application and verified that I am speaking with the correct person using two identifiers.   Location: Patient: Home Provider: Office   I discussed the limitations of evaluation and management by telemedicine and the availability of in person appointments. The patient expressed understanding and agreed to proceed.   Change in cough, now more productive/greenish and dyspneic, felt like she was drowning last night.   Rx for azithromycin and prednisone taper sent to her preferred pharmacy for AECOPD.  A total of 9 minutes was spent in chart review, discussion and sending prescription for AECOPD.

## 2022-03-14 ENCOUNTER — Telehealth (HOSPITAL_COMMUNITY): Payer: Self-pay

## 2022-03-14 NOTE — Telephone Encounter (Signed)
Received referral from Dr. Silas Flood for this Mckenzie Sullivan to participate in Pulmonary Rehab with the diagnosis of centrilobular emphysema. Clinical review of Mckenzie Sullivan follow up appt on 03/03/2022 Pulmonary office note. Mckenzie Sullivan appropriate for scheduling for Pulmonary rehab. Will forward to support staff for scheduling and verification of insurance eligibility/benefits with Mckenzie Sullivan consent.   Janine Ores, RN, BSN Cardiac and Pulmonary Rehab

## 2022-03-16 ENCOUNTER — Ambulatory Visit: Payer: BC Managed Care – PPO | Admitting: Student

## 2022-03-16 ENCOUNTER — Encounter: Payer: Self-pay | Admitting: Student

## 2022-03-16 ENCOUNTER — Telehealth (HOSPITAL_COMMUNITY): Payer: Self-pay

## 2022-03-16 ENCOUNTER — Ambulatory Visit (INDEPENDENT_AMBULATORY_CARE_PROVIDER_SITE_OTHER): Payer: BC Managed Care – PPO

## 2022-03-16 ENCOUNTER — Telehealth: Payer: Self-pay | Admitting: Pulmonary Disease

## 2022-03-16 VITALS — BP 138/72 | HR 91 | Ht 63.0 in | Wt 194.0 lb

## 2022-03-16 DIAGNOSIS — R0600 Dyspnea, unspecified: Secondary | ICD-10-CM

## 2022-03-16 DIAGNOSIS — Z87891 Personal history of nicotine dependence: Secondary | ICD-10-CM

## 2022-03-16 MED ORDER — CEFDINIR 300 MG PO CAPS: 300.0000 mg | ORAL_CAPSULE | Freq: Two times a day (BID) | ORAL | 0 refills | Status: DC

## 2022-03-16 MED ORDER — PREDNISONE 10 MG PO TABS: ORAL_TABLET | ORAL | 0 refills | Status: DC

## 2022-03-16 NOTE — Telephone Encounter (Signed)
Mckenzie Sullivan called in bc Pt needs a Prior Auth from ins. Mckenzie Sullivan has started the process [employer] and now needs for the clinical notes and Pol# be sent to ins fax Info down below.  [facility] Dept # [phone] Opt 3  Ref# [id number] Fax: [phone] Attn: Nurse Reviewer Trenton Founds

## 2022-03-16 NOTE — Patient Instructions (Signed)
-   Take cefdinir 1 tablet twice daily till gone and finish your course of prednisone, azithromycin - x ray today, swab today - keep doing trelegy 1 puff once daily rinse mouth and brush teeth/tongue after each use - mucinex 600mg -1200mg  twice daily to thin mucus till you're over this - xopenex neb as needed and every night until you're over this - would follow your trelegy and your xopenex with flutter valve 10 slow but firm puffs twice daliy - ask [facility] for facemask for oxygen

## 2022-03-16 NOTE — Progress Notes (Signed)
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 But still coughing up brownish mucus. She feels it in her teeth as well now with a great deal of sinus congestion  Worse dyspnea than when we last spoke.     Otherwise pertinent review of systems is negative.  Past Medical History:  Diagnosis Date   Abdominal aortic atherosclerosis (HCC)    CHF (congestive heart failure) (HCC)    Complication of anesthesia 2017   problems with O2 sats   COPD (chronic obstructive pulmonary disease) (HCC)    Dyslipidemia    History of kidney stones    Hypertension    Kidney stones    Oxygen dependent    Pneumonia      Family History  Problem Relation Age of Onset   Pneumonia Mother      Past Surgical History:  Procedure Laterality Date   CARDIAC CATHETERIZATION     20'$ [place]     has had numerous cystoscopies since 2017   [place], URETEROSCOPY AND STENT PLACEMENT Bilateral 10/08/2021   Procedure: CYSTOSCOPY WITH LEFT RETROGRADE PYELOGRAM, URETEROSCOPY WIHT HOLMIUM LASER AND STENT PLACEMENT AND RIGHT RETROGRADE PYELOGRAM;  Surgeon: Lucas Mallow, MD;  Location: [facility];  Service: Urology;  Laterality: Bilateral;  60 MINS FOR CASE   CYSTOSCOPY/URETEROSCOPY/HOLMIUM LASER/STENT PLACEMENT Left 11/01/2021   Procedure: CYSTOSCOPY/URETEROSCOPY/STENT PLACEMENT;  Surgeon: Lucas Mallow, MD;  Location: [facility];  Service: Urology;  Laterality: Left;  1 HR FOR CASE   EXTRACORPOREAL SHOCK WAVE LITHOTRIPSY Left 03/18/2021   Procedure: LEFT EXTRACORPOREAL  SHOCK WAVE LITHOTRIPSY (ESWL);  Surgeon: Vira Agar, MD;  Location: [facility];  Service: Urology;  Laterality: Left;   EXTRACORPOREAL SHOCK WAVE LITHOTRIPSY Left 01/21/2022   Procedure: LEFT EXTRACORPOREAL SHOCK WAVE LITHOTRIPSY (ESWL);  Surgeon: Lucas Mallow, MD;  Location: [facility];  Service: Urology;  Laterality: Left;   EXTRACORPOREAL SHOCK WAVE LITHOTRIPSY Left 01/28/2022   Procedure: LEFT EXTRACORPOREAL SHOCK WAVE LITHOTRIPSY (ESWL);  Surgeon: Robley Fries, MD;  Location: [facility];  Service: Urology;  Laterality: Left;  33 MINS   TONSILLECTOMY     tummy tuck  2007   with breast lift    Social History   Socioeconomic History   Marital status: Married    Spouse name: Not on file   Number of children: Not on file   Years of education: Not on file   Highest education level: Not on file  Occupational History   Not on file  Tobacco Use   Smoking status: Former    Years: 30.00    Types: Cigarettes    Quit date: 2010    Years since quitting: 14.2   Smokeless tobacco: Never  Vaping Use   Vaping Use: Never used  Substance and Sexual Activity   Alcohol use: Yes    Comment: rarely   Drug use: Not Currently    Types: Marijuana   Sexual activity: Not Currently  Other Topics Concern   Not on file  Social History Narrative   Not on file   Social Determinants of Health   Financial Resource Strain: Not on file  Food Insecurity: Not on file  Transportation Needs: Not on file  Physical Activity: Not on file  Stress: Not on file  Social Connections: Not on file  Intimate Partner Violence: Not on file     Allergies  Allergen Reactions   Penicillins Itching   Cephalexin Other (See Comments)    Upset stomach     Outpatient Medications Prior to Visit  Medication Sig Dispense Refill   azithromycin (ZITHROMAX) 250 MG tablet Two tablets on day 1, then 1 tablet daily till gone 6 tablet 0   estradiol (ESTRACE)  0.1 MG/GM vaginal cream Place vaginally.     Fluticasone-Umeclidin-Vilant (TRELEGY ELLIPTA) 100-62.5-25 MCG/ACT AEPB Inhale 1 puff into the lungs daily. 60 each 6   halobetasol (ULTRAVATE) 0.05 % cream Apply topically.     levalbuterol (XOPENEX HFA) 45 MCG/ACT inhaler Inhale into the lungs every 4 (four) hours as needed for wheezing.     meloxicam (MOBIC) 15 MG tablet Take 15 mg by mouth daily.     OTEZLA 30 MG TABS Take 1 tablet by mouth 2 (two) times daily.     predniSONE (DELTASONE) 10 MG tablet Take 4 tabs by mouth for 3 days, then 3 for 3 days, 2 for 3 days, 1 for 3 days and stop 30 tablet 0   tamsulosin (FLOMAX) 0.4 MG CAPS capsule Take 0.4 mg by mouth daily.     No facility-administered medications prior to visit.       Objective:   Physical Exam:  General appearance: 62 y.o., female, NAD, conversant  Eyes: anicteric sclerae; PERRL, tracking appropriately HENT: NCAT; MMM Neck: Trachea midline; no lymphadenopathy, no JVD Lungs: faint wheeze bl, rhonchi, with normal respiratory effort CV: RRR, no murmur  Abdomen: Soft, non-tender; non-distended, BS present  Extremities: No peripheral edema, warm Skin: Normal turgor and texture; no rash Psych: Appropriate affect Neuro: Alert and oriented to person and place, no focal deficit     Vitals:   03/16/22 1357  BP: 138/72  Pulse: 91  SpO2: 95%  Weight: 194 lb (88 kg)  Height: '5\' 3"'$  (1.6 m)   95% on 2 LPM  BMI Readings from Last 3 Encounters:  03/16/22 34.37 kg/m  03/03/22 36.00 kg/m  01/28/22 35.70 kg/m   Wt Readings from Last 3 Encounters:  03/16/22 194 lb (88 kg)  03/03/22 196 lb 12.8 oz (89.3 kg)  01/28/22 195 lb 3.2 oz (88.5 kg)     CBC    Component Value Date/Time   WBC 11.3 (H) 11/01/2021 0636   RBC 4.43 11/01/2021 0636   HGB 11.8 (L) 11/01/2021 0636   HCT 37.2 11/01/2021 0636   PLT 381 11/01/2021 0636   MCV 84.0 11/01/2021 0636   MCH 26.6 11/01/2021 0636   MCHC 31.7 11/01/2021 0636   RDW 14.3  11/01/2021 0636   LYMPHSABS 2.4 05/08/2021 1504   MONOABS 0.9 05/08/2021 1504   EOSABS 0.2 05/08/2021 1504   BASOSABS 0.1 05/08/2021 1504    Eos 200-300  Chest Imaging: LDCT CHest 04/08/21 with upper lobe predominant emphysema, small nodules  Pulmonary Functions Testing Results:     No data to display           Echocardiogram 03/15/21:   Left ventricle cavity is normal in size and wall thickness. Normal global  wall motion. Normal LV systolic function with EF 65%. Normal diastolic  filling  pattern.  Mild (Grade I) aortic regurgitation.  Normal right atrial pressure.       Assessment & Plan:   # AECOPD  # Acute sinusitis  Plan: - Take cefdinir 1 tablet twice daily till gone and finish your course of prednisone, azithromycin - x ray today, swab today - keep doing trelegy 1 puff once daily rinse mouth and brush teeth/tongue after each use - mucinex '600mg'$ -'1200mg'$  twice daily to thin mucus till you're over this - xopenex neb as needed and every night until you're over this - would follow your trelegy and your xopenex with flutter valve 10 slow but firm puffs twice daliy - ask [facility] for facemask for oxygen  RTC 08/2022 with Dr. Silas Flood   Maryjane Hurter, MD [facility] 03/16/2022 2:08 PM

## 2022-03-16 NOTE — Telephone Encounter (Signed)
Pt insurance is active and benefits verified through [employer] Co-pay 0, DED $500/$500 met, out of pocket $4,000/$1,273.86 met, co-insurance 20%. yes pre-authorization is required, Jessica/[employer] 03/16/22'@10'$ :36am, REF# [id number]'@11'$ :14 Authorization started for pulmonary rehab. Called and spoke with Rayisha at pulmonary to send clinical information and policy number to [employer] company at F) [phone] provided the telephone number as well [id number] opt 3 Ref# for the call [id number]

## 2022-03-17 NOTE — Telephone Encounter (Signed)
Last OV notes have been printed and faxed to [facility].

## 2022-05-07 ENCOUNTER — Encounter: Payer: Self-pay | Admitting: Pulmonary Disease

## 2022-05-09 ENCOUNTER — Ambulatory Visit: Payer: BC Managed Care – PPO

## 2022-05-09 ENCOUNTER — Encounter: Payer: Self-pay | Admitting: Cardiology

## 2022-05-09 ENCOUNTER — Ambulatory Visit: Payer: BC Managed Care – PPO | Admitting: Cardiology

## 2022-05-09 VITALS — BP 151/64 | HR 87 | Resp 18 | Ht 63.0 in | Wt 204.8 lb

## 2022-05-09 DIAGNOSIS — J449 Chronic obstructive pulmonary disease, unspecified: Secondary | ICD-10-CM

## 2022-05-09 DIAGNOSIS — I700 Atherosclerosis of aorta: Secondary | ICD-10-CM

## 2022-05-09 DIAGNOSIS — E785 Hyperlipidemia, unspecified: Secondary | ICD-10-CM

## 2022-05-09 DIAGNOSIS — Z9981 Dependence on supplemental oxygen: Secondary | ICD-10-CM

## 2022-05-09 DIAGNOSIS — I10 Essential (primary) hypertension: Secondary | ICD-10-CM

## 2022-05-09 DIAGNOSIS — Z87891 Personal history of nicotine dependence: Secondary | ICD-10-CM

## 2022-05-09 MED ORDER — LOSARTAN POTASSIUM 25 MG PO TABS: 25.0000 mg | ORAL_TABLET | Freq: Every morning | ORAL | 0 refills | Status: DC

## 2022-05-09 NOTE — Progress Notes (Signed)
Date:  05/09/2022   ID:  [name], DOB 02-25-1960, MRN [id number]  PCP:  Lance Bosch, NP  Cardiologist:  Tessa Lerner, DO, [facility] (established care 03/15/2021) Former Cardiology Providers: Karleen Hampshire, PA; Dr. Laverta Baltimore (EP).   Date: 05/09/22 Last Office Visit: November 08, 2021  Chief Complaint  Patient presents with   Hypertension   Follow-up    HPI  Mckenzie Sullivan is a 62 y.o. Caucasian female whose past medical history and cardiovascular risk factors include: HTN, dyslipidemia, diabetes mellitus type 2 (diet controlled), COPD requiring supplemental oxygen 2L at rest and 3 L with exertion, former smoker, atherosclerosis of the abdominal aorta.   Patient presents today for 1 year follow-up visit.  She recently moved from a [place] to [place] and transition her care in March 2023.  Patient was asked to see cardiology prior to upcoming lithotripsy and possible stent placement.  During that time her blood pressures were quite elevated and she was started on pharmacological therapy.  She has tolerated initiation of losartan and hydrochlorothiazide well; however, her blood pressures were soft at the last visit and therefore hydrochlorothiazide was discontinued.  Since last office visit, doing well from a cardiovascular standpoint.  She denies anginal chest pain or heart failure symptoms.  Due to miscommunication patient has stopped both losartan and hydrochlorothiazide and her blood pressures at today's office visit are not well-controlled.  She has not been checking her blood pressures often at home but based on the numbers that she has SBP is consistently more than 130 mmHg.  Patient states that she just ran out of losartan and did not realize it had to be refilled.  She continues to be on 2 L of nasal cannula oxygen at rest and 3 L with exertion.  She follows up with PCP tomorrow for yearly well visit and will have labs including lipids checked.   ALLERGIES: Allergies   Allergen Reactions   Penicillins Itching   Cephalexin Other (See Comments)    Upset stomach    MEDICATION LIST PRIOR TO VISIT: Current Meds  Medication Sig   atorvastatin (LIPITOR) 40 MG tablet Take 40 mg by mouth daily.   estradiol (ESTRACE) 0.1 MG/GM vaginal cream Place vaginally.   Fluticasone-Umeclidin-Vilant (TRELEGY ELLIPTA) 100-62.5-25 MCG/ACT AEPB Inhale 1 puff into the lungs daily.   halobetasol (ULTRAVATE) 0.05 % cream Apply topically.   levalbuterol (XOPENEX HFA) 45 MCG/ACT inhaler Inhale into the lungs every 4 (four) hours as needed for wheezing.   losartan (COZAAR) 25 MG tablet Take 1 tablet (25 mg total) by mouth every morning.   meloxicam (MOBIC) 15 MG tablet Take 15 mg by mouth daily.   OTEZLA 30 MG TABS Take 1 tablet by mouth 2 (two) times daily.   tamsulosin (FLOMAX) 0.4 MG CAPS capsule Take 0.4 mg by mouth daily.   venlafaxine XR (EFFEXOR-XR) 150 MG 24 hr capsule Take 150 mg by mouth daily.     PAST MEDICAL HISTORY: Past Medical History:  Diagnosis Date   Abdominal aortic atherosclerosis (HCC)    CHF (congestive heart failure) (HCC)    Complication of anesthesia 2017   problems with O2 sats   COPD (chronic obstructive pulmonary disease) (HCC)    Dyslipidemia    History of kidney stones    Hypertension    Kidney stones    Oxygen dependent    Pneumonia     PAST SURGICAL HISTORY: Past Surgical History:  Procedure Laterality Date   CARDIAC CATHETERIZATION     2017  CESAREAN SECTION     CYSTOSCOPY W/ URETERAL STENT PLACEMENT     has had numerous cystoscopies since 2017   CYSTOSCOPY WITH RETROGRADE PYELOGRAM, URETEROSCOPY AND STENT PLACEMENT Bilateral 10/08/2021   Procedure: CYSTOSCOPY WITH LEFT RETROGRADE PYELOGRAM, URETEROSCOPY WIHT HOLMIUM LASER AND STENT PLACEMENT AND RIGHT RETROGRADE PYELOGRAM;  Surgeon: Crista Elliot, MD;  [facility]: [facility];  Service: Urology;  Laterality: Bilateral;  60 MINS FOR CASE   CYSTOSCOPY/URETEROSCOPY/HOLMIUM  LASER/STENT PLACEMENT Left 11/01/2021   Procedure: CYSTOSCOPY/URETEROSCOPY/STENT PLACEMENT;  Surgeon: Crista Elliot, MD;  Location: [facility];  Service: Urology;  Laterality: Left;  1 HR FOR CASE   EXTRACORPOREAL SHOCK WAVE LITHOTRIPSY Left 03/18/2021   Procedure: LEFT EXTRACORPOREAL SHOCK WAVE LITHOTRIPSY (ESWL);  Surgeon: Despina Arias, MD;  Location[facility];  Service: Urology;  Laterality: Left;   EXTRACORPOREAL SHOCK WAVE LITHOTRIPSY Left 01/21/2022   Procedure: LEFT EXTRACORPOREAL SHOCK WAVE LITHOTRIPSY (ESWL);  Surgeon: Crista Elliot, MD;  Location: [facility];  Service: Urology;  Laterality: Left;   EXTRACORPOREAL SHOCK WAVE LITHOTRIPSY Left 01/28/2022   Procedure: LEFT EXTRACORPOREAL SHOCK WAVE LITHOTRIPSY (ESWL);  Surgeon: Noel Christmas, MD;  Location: [facility];  Service: Urology;  Laterality: Left;  90 MINS   TONSILLECTOMY     tummy tuck  2007   with breast lift    FAMILY HISTORY: The patient family history includes Pneumonia in her mother; Uterine cancer in her daughter.  SOCIAL HISTORY:  The patient  reports that she quit smoking about 14 years ago. Her smoking use included cigarettes. She has never used smokeless tobacco. She reports current alcohol use. She reports that she does not currently use drugs after having used the following drugs: Marijuana.  REVIEW OF SYSTEMS: Review of Systems  Cardiovascular:  Positive for dyspnea on exertion (chronic and stable.). Negative for chest pain, claudication, leg swelling, near-syncope, orthopnea, palpitations, paroxysmal nocturnal dyspnea and syncope.  Respiratory:  Positive for shortness of breath (chronic and stable.).     PHYSICAL EXAM:    05/09/2022   11:08 AM 03/16/2022    1:57 PM 03/03/2022    2:31 PM  Vitals with BMI  Height 5\' 3"  5\' 3"    Weight 204 lbs 13 oz 194 lbs 196 lbs 13 oz  BMI 36.29 34.37   Systolic 151 138 829  Diastolic 64 72 78  Pulse 87 91 88     Physical Exam  Constitutional: No distress.  Age appropriate, hemodynamically stable.   Neck: No JVD present.  Cardiovascular: Normal rate, regular rhythm, S1 normal, S2 normal, intact distal pulses and normal pulses. Exam reveals no gallop, no S3 and no S4.  No murmur heard. Pulmonary/Chest: No stridor. She has no wheezes. She has no rales.  Clear to auscultation bilaterally upper lung fields with decreased breath sounds at the bases.   Abdominal: Soft. Bowel sounds are normal. She exhibits no distension. There is no abdominal tenderness.  Musculoskeletal:        General: No edema.     Cervical back: Neck supple.  Neurological: She is alert and oriented to person, place, and time. She has intact cranial nerves (2-12).  Skin: Skin is warm and moist.   CARDIAC DATABASE: EKG: 03/15/2021: NSR, 74 bpm, normal axis, negative precordial T waves.  May 09, 2022: Sinus rhythm, 78 bpm, normal axis, without underlying ischemia or injury pattern.  History  Echocardiogram: 04/03/2019 (@ [facility]): LVEF 60%. No significant valve disease.  03/15/2021: Left  ventricle cavity is normal in size and wall thickness. Normal global wall motion. Normal LV systolic function with EF 65%. Normal diastolic filling pattern.  Mild (Grade I) aortic regurgitation. Normal right atrial pressure.   Stress Testing: No results found for this or any previous visit from the past 1095 days.   Heart Catheterization: Cardiac catheterization 10/06/2015 (@ [facility]); RA 16, RV 48/10, PA 48/21, PAOP 20, CO 4.86, CI 5.4, LVEDP 23 mmHg. LVEF 60%. Mild nonobstructive CAD; 30% narrowing of the origin of the first diagonal branch.   LABORATORY DATA:    Latest Ref Rng & Units 11/01/2021    6:36 AM 10/04/2021    2:30 PM 05/08/2021    3:04 PM  CBC  WBC 4.0 - 10.5 K/uL 11.3  11.7  12.4   Hemoglobin 12.0 - 15.0 g/dL 16.1  09.6  04.5   Hematocrit 36.0 - 46.0 % 37.2  36.4  41.3   Platelets 150 - 400  K/uL 381  356  458        Latest Ref Rng & Units 11/01/2021    6:36 AM 10/08/2021   10:34 AM 10/04/2021    2:30 PM  CMP  Glucose 70 - 99 mg/dL 409   91   BUN 6 - 20 mg/dL 14   16   Creatinine 8.11 - 1.00 mg/dL 9.14   7.82   Sodium 956 - 145 mmol/L 137   136   Potassium 3.5 - 5.1 mmol/L 2.8  3.2  2.9   Chloride 98 - 111 mmol/L 101   99   CO2 22 - 32 mmol/L 25   31   Calcium 8.9 - 10.3 mg/dL 9.2   9.3     Lipid Panel  No results found for: "CHOL", "TRIG", "HDL", "CHOLHDL", "VLDL", "LDLCALC", "LDLDIRECT", "LABVLDL"  No components found for: "NTPROBNP" No results for input(s): "PROBNP" in the last 8760 hours. No results for input(s): "TSH" in the last 8760 hours.  BMP Recent Labs    10/04/21 1430 10/08/21 1034 11/01/21 0636  NA 136  --  137  K 2.9* 3.2* 2.8*  CL 99  --  101  CO2 31  --  25  GLUCOSE 91  --  119*  BUN 16  --  14  CREATININE 0.94  --  1.09*  CALCIUM 9.3  --  9.2  GFRNONAA >60  --  59*    HEMOGLOBIN A1C No results found for: "HGBA1C", "MPG"  External Labs: Collected: May 25, 2021. BUN 13, creatinine 0.87. Sodium 143,Potassium 3.8, chloride 100, bicarb 29,. AST 18, ALT 18, alkaline phosphatase 130 (alkaline phosphatase above normal limits). Hemoglobin 11.3 g/dL, hematocrit 21.3%  IMPRESSION:    ICD-10-CM   1. Benign hypertension  I10 EKG 12-Lead    losartan (COZAAR) 25 MG tablet    Basic metabolic panel    2. Dyslipidemia  E78.5     3. Chronic obstructive pulmonary disease, unspecified COPD type (HCC)  J44.9     4. Oxygen dependent  Z99.81     5. Atherosclerosis of aorta (HCC)  I70.0     6. Former smoker  Z87.891        RECOMMENDATIONS: Shaneta Oleksa is a 62 y.o. Caucasian female whose past medical history and cardiac risk factors include: HTN, dyslipidemia, diabetes mellitus type 2 (diet controlled), COPD requiring supplemental oxygen 2L at rest and 3 L with exertion, former smoker, atherosclerosis of the abdominal aorta.   Benign  hypertension Office blood pressures are  not at goal. At the last office visit hydrochlorothiazide was discontinued due to soft blood pressures. However, it appears that due to misunderstanding she also stopped her losartan. Home blood pressures are also not well-controlled. Will restart losartan 25 mg p.o. daily. Labs in 1 week to evaluate kidney function. Patient is asked to keep a log of her blood pressures and if her SBP is not consistently < 130 mmHg will uptitrate medical therapy.   Dyslipidemia Currently on atorvastatin . Does not endorse myalgias. Will have labs with PCP.  I have asked her to provide [place] a copy for reference.  Chronic obstructive pulmonary disease, unspecified COPD type (HCC) / Oxygen dependent Currently on 2 L nasal cannula at rest and 3 L with exertion. Encouraged to follow-up with pulmonary medicine. Patient plans to start pulmonary physical therapy.   FINAL MEDICATION LIST END OF ENCOUNTER: Meds ordered this encounter  Medications   losartan (COZAAR) 25 MG tablet    Sig: Take 1 tablet (25 mg total) by mouth every morning.    Dispense:  30 tablet    Refill:  0    Medications Discontinued During This Encounter  Medication Reason   azithromycin (ZITHROMAX) 250 MG tablet    cefdinir (OMNICEF) 300 MG capsule    predniSONE (DELTASONE) 10 MG tablet    predniSONE (DELTASONE) 10 MG tablet       Current Outpatient Medications:    atorvastatin (LIPITOR) 40 MG tablet, Take 40 mg by mouth daily., Disp: , Rfl:    estradiol (ESTRACE) 0.1 MG/GM vaginal cream, Place vaginally., Disp: , Rfl:    Fluticasone-Umeclidin-Vilant (TRELEGY ELLIPTA) 100-62.5-25 MCG/ACT AEPB, Inhale 1 puff into the lungs daily., Disp: 60 each, Rfl: 6   halobetasol (ULTRAVATE) 0.05 % cream, Apply topically., Disp: , Rfl:    levalbuterol (XOPENEX HFA) 45 MCG/ACT inhaler, Inhale into the lungs every 4 (four) hours as needed for wheezing., Disp: , Rfl:    losartan (COZAAR) 25 MG tablet, Take 1  tablet (25 mg total) by mouth every morning., Disp: 30 tablet, Rfl: 0   meloxicam (MOBIC) 15 MG tablet, Take 15 mg by mouth daily., Disp: , Rfl:    OTEZLA 30 MG TABS, Take 1 tablet by mouth 2 (two) times daily., Disp: , Rfl:    tamsulosin (FLOMAX) 0.4 MG CAPS capsule, Take 0.4 mg by mouth daily., Disp: , Rfl:    venlafaxine XR (EFFEXOR-XR) 150 MG 24 hr capsule, Take 150 mg by mouth daily., Disp: , Rfl:   Orders Placed This Encounter  Procedures   Basic metabolic panel   EKG 12-Lead    There are no Patient Instructions on file for this visit.   --Continue cardiac medications as reconciled in final medication list. --Return in about 6 months (around 11/09/2022) for Follow up. Or sooner if needed. --Continue follow-up with your primary care physician regarding the management of your other chronic comorbid conditions.  Patient's questions and concerns were addressed to her satisfaction. She voices understanding of the instructions provided during this encounter.   This note was created using a voice recognition software as a result there may be grammatical errors inadvertently enclosed that do not reflect the nature of this encounter. Every attempt is made to correct such errors.  Tessa Lerner, [place], [facility]  Pager:  [phone] Office: [phone]

## 2022-05-12 ENCOUNTER — Other Ambulatory Visit: Payer: Self-pay

## 2022-05-12 DIAGNOSIS — I10 Essential (primary) hypertension: Secondary | ICD-10-CM

## 2022-05-13 ENCOUNTER — Encounter: Payer: Self-pay | Admitting: Cardiology

## 2022-05-21 LAB — BASIC METABOLIC PANEL
BUN/Creatinine Ratio: 15 (ref 12–28)
BUN: 13 mg/dL (ref 8–27)
CO2: 25 mmol/L (ref 20–29)
Calcium: 10.1 mg/dL (ref 8.7–10.3)
Chloride: 100 mmol/L (ref 96–106)
Creatinine, Ser: 0.84 mg/dL (ref 0.57–1.00)
Glucose: 138 mg/dL — ABNORMAL HIGH (ref 70–99)
Potassium: 4.6 mmol/L (ref 3.5–5.2)
Sodium: 142 mmol/L (ref 134–144)
eGFR: 80 mL/min/{1.73_m2} (ref >59)

## 2022-05-23 ENCOUNTER — Telehealth: Payer: Self-pay

## 2022-05-23 NOTE — Telephone Encounter (Signed)
Patient doing well she has started Ozempic per PCP. He bp's are running in the normal range. Patient did not have exact readings with her at time of call. She has had no issues.

## 2022-05-23 NOTE — Telephone Encounter (Signed)
I don't think I have started on Ozempic.   Mckenzie Sullivan [place], DO, [facility]

## 2022-05-23 NOTE — Telephone Encounter (Signed)
-----   Message from [place], [place] sent at 05/22/2022 10:46 PM EDT ----- Labs remain stable after starting Losartan.  How are her BP?  Sunit [place], DO, [facility]

## 2022-05-27 NOTE — Progress Notes (Signed)
External Labs: Collected: May 10, 2022 provided by PCP. BUN 15, creatinine 0.76. Sodium 141, potassium 5.1, chloride 103, bicarb 23 AST 21, ALT 23, Alkaline phosphatase 131 (above normal limits), prior levels 127 in November 2023. Total cholesterol 196, triglycerides 214, LDL calculated 107, HDL 52, A1c 6.3. Vitamin D 9.0  Given her comorbidities would recommend better control of LDL and triglyceride levels.  It appears that she was recently started on Ozempic once she is stable on the Ozempic dose may consider rechecking lipids prior to the next office visit to see if further medication titration is warranted.  Alby Schwabe [place], DO, [facility]

## 2022-05-31 NOTE — Progress Notes (Signed)
Called patient to inform her about her lab results. Patient understood

## 2022-06-08 ENCOUNTER — Telehealth (HOSPITAL_COMMUNITY): Payer: Self-pay

## 2022-06-08 NOTE — Telephone Encounter (Signed)
Pt insurance does not cover pulmonary rehab. Closed referral.

## 2022-06-15 ENCOUNTER — Other Ambulatory Visit: Payer: Self-pay | Admitting: *Deleted

## 2022-06-15 DIAGNOSIS — Z87891 Personal history of nicotine dependence: Secondary | ICD-10-CM

## 2022-06-15 DIAGNOSIS — Z122 Encounter for screening for malignant neoplasm of respiratory organs: Secondary | ICD-10-CM

## 2022-06-19 ENCOUNTER — Other Ambulatory Visit: Payer: Self-pay | Admitting: Cardiology

## 2022-06-19 DIAGNOSIS — I10 Essential (primary) hypertension: Secondary | ICD-10-CM

## 2022-07-06 ENCOUNTER — Ambulatory Visit (INDEPENDENT_AMBULATORY_CARE_PROVIDER_SITE_OTHER): Payer: BC Managed Care – PPO | Admitting: Physician Assistant

## 2022-07-06 ENCOUNTER — Encounter: Payer: Self-pay | Admitting: Physician Assistant

## 2022-07-06 DIAGNOSIS — Z87891 Personal history of nicotine dependence: Secondary | ICD-10-CM | POA: Diagnosis not present

## 2022-07-06 NOTE — Patient Instructions (Signed)
Thank you for participating in the [facility]. It was our pleasure to meet you today. We will call you with the results of your scan within the next few days. Your scan will be assigned a Lung RADS category score by the physicians reading the scans.  This Lung RADS score determines follow up scanning.  See below for description of categories, and follow up screening recommendations. We will be in touch to schedule your follow up screening annually or based on recommendations of our providers. We will fax a copy of your scan results to your Primary Care Physician, or the physician who referred you to the program, to ensure they have the results. Please call the office if you have any questions or concerns regarding your scanning experience or results.  Our office number is [phone]. Please speak with Denise Phelps, RN. , or  Denise Buckner RN, They are  our Lung Cancer Screening RN.'s If They are unavailable when you call, Please leave a message on the voice mail. We will return your call at our earliest convenience.This voice mail is monitored several times a day.  Remember, if your scan is normal, we will scan you annually as long as you continue to meet the criteria for the program. (Age 50-80, Current smoker or smoker who has quit within the last 15 years). If you are a smoker, remember, quitting is the single most powerful action that you can take to decrease your risk of lung cancer and other pulmonary, breathing related problems. We know quitting is hard, and we are here to help.  Please let us know if there is anything we can do to help you meet your goal of quitting. If you are a former smoker, congratulations. We are proud of you! Remain smoke free! Remember you can refer friends or family members through the number above.  We will screen them to make sure they meet criteria for the program. Thank you for helping us take better care of you by  participating in Lung Screening.  You can receive free nicotine replacement therapy ( patches, gum or mints) by calling 1-800-QUIT NOW. Please call so we can get you on the path to becoming  a non-smoker. I know it is hard, but you can do this!  Lung RADS Categories:  Lung RADS 1: no nodules or definitely non-concerning nodules.  Recommendation is for a repeat annual scan in 12 months.  Lung RADS 2:  nodules that are non-concerning in appearance and behavior with a very low likelihood of becoming an active cancer. Recommendation is for a repeat annual scan in 12 months.  Lung RADS 3: nodules that are probably non-concerning , includes nodules with a low likelihood of becoming an active cancer.  Recommendation is for a 6-month repeat screening scan. Often noted after an upper respiratory illness. We will be in touch to make sure you have no questions, and to schedule your 6-month scan.  Lung RADS 4 A: nodules with concerning findings, recommendation is most often for a follow up scan in 3 months or additional testing based on our provider's assessment of the scan. We will be in touch to make sure you have no questions and to schedule the recommended 3 month follow up scan.  Lung RADS 4 B:  indicates findings that are concerning. We will be in touch with you to schedule additional diagnostic testing based on our provider's  assessment of the scan.  Other options for assistance in smoking cessation (   As covered by your insurance benefits)  Hypnosis for smoking cessation  [employer] [phone]  Acupuncture for smoking cessation  [facility] [phone]   

## 2022-07-06 NOTE — Progress Notes (Signed)
Virtual Visit via Telephone Note  I connected with Mckenzie Sullivan on 07/06/22 at 10:25 AM by telephone and verified that I am speaking with the correct person using two identifiers.  Location: Patient: home Provider: working virtually from home   I discussed the limitations, risks, security and privacy concerns of performing an evaluation and management service by telephone and the availability of in person appointments. I also discussed with the patient that there may be a patient responsible charge related to this service. The patient expressed understanding and agreed to proceed.       Shared Decision Making Visit Lung Cancer Screening Program [phone])   Eligibility: Age 60 Pack Years Smoking History Calculation 37 (# packs/per year x # years smoked) Recent History of coughing up blood  no Unexplained weight loss? No ( >Than 15 pounds within the last 6 months ) Prior History Lung / other cancer No (Diagnosis within the last 5 years already requiring surveillance chest CT Scans). Smoking Status Former Smoker Former Smokers: Years since quit: 14 years  Quit Date: 2010  Visit Components: Discussion included one or more decision making aids? Yes Discussion included risk/benefits of screening. Yes Discussion included potential follow up diagnostic testing for abnormal scans. Yes Discussion included meaning and risk of over diagnosis. Yes Discussion included meaning and risk of False Positives. Yes Discussion included meaning of total radiation exposure. Yes  Counseling Included: Importance of adherence to annual lung cancer LDCT screening. Yes Impact of comorbidities on ability to participate in the program. Yes Ability and willingness to under diagnostic treatment. Yes  Smoking Cessation Counseling: Former Smokers:  Discussed the importance of maintaining cigarette abstinence. Yes Diagnosis Code: Personal History of Nicotine Dependence. X91.478 Information about  tobacco cessation classes and interventions provided to patient. Yes Written Order for Lung Cancer Screening with LDCT placed in Epic. Yes (CT Chest Lung Cancer Screening Low Dose W/O CM) [id number] Z12.2-Screening of respiratory organs Z87.891-Personal history of nicotine dependence    I spent 25 minutes of face to face time/virtual visit time  with the patient discussing the risks and benefits of lung cancer screening. We took the time to pause at intervals to allow for questions to be asked and answered to ensure understanding. We discussed that they had taken the single most powerful action possible to decrease their risk of developing lung cancer when they quit smoking. I counseled them to remain smoke free, and to contact the office if they ever had the desire to smoke again so that we can provide resources and tools to help support the effort to remain smoke free. We discussed the time and location of the scan, and they  will receive a call or letter with the results within  24-72 hours of receiving them. They have the office contact information in the event they have questions.   They verbalized understanding of all of the above and had no further questions.    I explained to the patient that there has been a high incidence of coronary artery disease noted on these exams. I explained that this is a non-gated exam therefore degree or severity cannot be determined. This patient is on statin therapy. I have asked the patient to follow-up with their PCP regarding any incidental finding of coronary artery disease and management with diet or medication as they feel is clinically indicated. The patient verbalized understanding of the above and had no further questions.      Darcella Gasman [place], PA-C

## 2022-07-08 ENCOUNTER — Ambulatory Visit (HOSPITAL_BASED_OUTPATIENT_CLINIC_OR_DEPARTMENT_OTHER)
Admission: RE | Admit: 2022-07-08 | Discharge: 2022-07-08 | Disposition: A | Discharge status: Home / Self Care | Payer: BC Managed Care – PPO | Source: Ambulatory Visit | Attending: Acute Care | Admitting: Acute Care

## 2022-07-08 DIAGNOSIS — Z122 Encounter for screening for malignant neoplasm of respiratory organs: Secondary | ICD-10-CM | POA: Diagnosis present

## 2022-07-08 DIAGNOSIS — Z87891 Personal history of nicotine dependence: Secondary | ICD-10-CM

## 2022-07-11 ENCOUNTER — Other Ambulatory Visit: Payer: Self-pay

## 2022-07-11 DIAGNOSIS — Z122 Encounter for screening for malignant neoplasm of respiratory organs: Secondary | ICD-10-CM

## 2022-07-11 DIAGNOSIS — Z87891 Personal history of nicotine dependence: Secondary | ICD-10-CM

## 2022-09-13 ENCOUNTER — Encounter: Payer: Self-pay | Admitting: Pulmonary Disease

## 2022-09-13 ENCOUNTER — Ambulatory Visit: Payer: BC Managed Care – PPO | Admitting: Pulmonary Disease

## 2022-09-13 VITALS — BP 120/60 | HR 77 | Temp 98.8°F | Ht 62.0 in | Wt 191.6 lb

## 2022-09-13 DIAGNOSIS — J432 Centrilobular emphysema: Secondary | ICD-10-CM | POA: Diagnosis not present

## 2022-09-13 DIAGNOSIS — R0609 Other forms of dyspnea: Secondary | ICD-10-CM

## 2022-09-13 MED ORDER — BREZTRI AEROSPHERE 160-9-4.8 MCG/ACT IN AERO: 2.0000 | INHALATION_SPRAY | Freq: Two times a day (BID) | RESPIRATORY_TRACT | 0 refills | Status: DC

## 2022-09-13 NOTE — Patient Instructions (Signed)
Nice to see you again  Lets try some different inhaler  Use Breztri 2 puffs twice a day every day, rinse your mouth out with water after every use  This replaces Trelegy, do not use Trelegy while using Breztri  If you find the Markus Daft is more beneficial helps you breathe easier etc., please send me a MyChart message and I can prescribe this.  If the [facility] makes no difference, okay to stick with the Trelegy moving forward.  Either way let me know.  I suspect he will need refills of Trelegy if the Markus Daft does not work out.  Return to clinic in 6 months or sooner as needed with Dr. Judeth Horn

## 2022-09-13 NOTE — Progress Notes (Signed)
@Patient  ID: Mckenzie Sullivan, female    DOB: 02-Oct-1960, 62 y.o.   MRN: [id number]  Chief Complaint  Patient presents with   Follow-up    Doing well.  Does have some SOB with exertion, but when rests, sx improve.    Referring provider: Lance Bosch, NP  HPI:   62 y.o. woman whom we are seeing in follow up for evaluation of COPD and chronic hypoxemic respiratory failure.    Overall doing well.  Did have exacerbation back 03/2022.  She was seen in this.  Note reviewed.  Antibiotics steroids prescribed.  This greatly improved her symptoms.  Dyspnea stable.  Still has baseline dyspnea but overall doing okay.  Discussed role and rationale for trying different inhaler different routes of administration super helps more.  She expressed understanding and is interested.  Recently had CT lung cancer screening 07/2022.  Reviewed in detail today with patient.  This is lung RADS 2, cleared and demonstrate no emphysema.  All questions answered to the best my ability.   HPI at initial visit: Patient was in normal state of health in 2017.  She went for surgery for kidney stones.  When she was awakened from anesthesia she had hypoxemia.  She is placed on oxygen.  Has been on oxygen ever since.  Often with dyspnea started at that time.  Not really dyspneic prior to that.  Did not know had an issue with oxygen.  She uses 3 L with exertion.  This is been relatively stable over the last few years.  She quit smoking in 2010.  She has a approximately 40 pack year smoking history prior to that.  Reviewed her most recent PFTs in 2022 that demonstrates mild to moderate fixed obstruction, moderate reduced DLCO.  Reviewed most recent cross-sectional imaging CT scan 2017 that demonstrated severe emphysema per report.  Reviewed recent CT kidney stone scan that does show signs of emphysematous changes in the lower lobes albeit mild.  Reviewed most recent echocardiogram 02/2021 that does not demonstrate or discuss any right  ventricular or right-sided abnormality.  Reviewed most recent echocardiogram in [place] 03/2019 that revealed normal RV size and function, normal RA size, normal RA pressure, no significant tricuspid regurgitation to suggest pulmonary hypertension.  She uses Trelegy and albuterol as needed.  She thinks they help a little bit.  She is not sure albuterol makes her feel any better, does give her the shakes.  She is currently enrolled in lung cancer screening with upcoming scan next week.  She denies any history of exacerbations of her underlying breathing or COPD requiring prednisone or hospitalization.  PMH: COPD, kidney stones Surgical history: Multiple lithotripsy, ureteral stent Family history: Mother history of pneumonia, no significant rest or illness in first relatives Social history: Former smoker, 40-pack-year, quit in 2010, lives in [address], moved to [place] from [place] in 2023 as husband got a new job at [employer] / Pulmonary Flowsheets:   ACT:      No data to display          MMRC:     No data to display          Epworth:      No data to display          Tests:   FENO:  No results found for: "NITRICOXIDE"  PFT:     No data to display          WALK:  No data to display          Imaging: Personally reviewed and as per EMR discussion this note No results found.  Lab Results: Personally reviewed CBC    Component Value Date/Time   WBC 11.3 (H) 11/01/2021 0636   RBC 4.43 11/01/2021 0636   HGB 11.8 (L) 11/01/2021 0636   HCT 37.2 11/01/2021 0636   PLT 381 11/01/2021 0636   MCV 84.0 11/01/2021 0636   MCH 26.6 11/01/2021 0636   MCHC 31.7 11/01/2021 0636   RDW 14.3 11/01/2021 0636   LYMPHSABS 2.4 05/08/2021 1504   MONOABS 0.9 05/08/2021 1504   EOSABS 0.2 05/08/2021 1504   BASOSABS 0.1 05/08/2021 1504    BMET    Component Value Date/Time   NA 142 05/20/2022 1301   K 4.6 05/20/2022  1301   CL 100 05/20/2022 1301   CO2 25 05/20/2022 1301   GLUCOSE 138 (H) 05/20/2022 1301   GLUCOSE 119 (H) 11/01/2021 0636   BUN 13 05/20/2022 1301   CREATININE 0.84 05/20/2022 1301   CALCIUM 10.1 05/20/2022 1301   GFRNONAA 59 (L) 11/01/2021 0636    BNP No results found for: "BNP"  ProBNP No results found for: "PROBNP"  Specialty Problems   None   Allergies  Allergen Reactions   Penicillins Itching   Cephalexin Other (See Comments)    Upset stomach    Immunization History  Administered Date(s) Administered   Influenza-Unspecified 11/05/2021    Past Medical History:  Diagnosis Date   Abdominal aortic atherosclerosis (HCC)    CHF (congestive heart failure) (HCC)    Complication of anesthesia 2017   problems with O2 sats   COPD (chronic obstructive pulmonary disease) (HCC)    Dyslipidemia    History of kidney stones    Hypertension    Kidney stones    Oxygen dependent    Pneumonia     Tobacco History: Social History   Tobacco Use  Smoking Status Former   Current packs/day: 0.00   Types: Cigarettes   Start date: 1980   Quit date: 2010   Years since quitting: 14.7  Smokeless Tobacco Never   Counseling given: Not Answered   Continue to not smoke  Outpatient Encounter Medications as of 09/13/2022  Medication Sig   atorvastatin (LIPITOR) 40 MG tablet Take 40 mg by mouth daily.   Budeson-Glycopyrrol-Formoterol (BREZTRI AEROSPHERE) 160-9-4.8 MCG/ACT AERO Inhale 2 puffs into the lungs in the morning and at bedtime.   estradiol (ESTRACE) 0.1 MG/GM vaginal cream Place vaginally.   Fluticasone-Umeclidin-Vilant (TRELEGY ELLIPTA) 100-62.5-25 MCG/ACT AEPB Inhale 1 puff into the lungs daily.   halobetasol (ULTRAVATE) 0.05 % cream Apply topically.   levalbuterol (XOPENEX HFA) 45 MCG/ACT inhaler Inhale into the lungs every 4 (four) hours as needed for wheezing.   losartan (COZAAR) 25 MG tablet TAKE ONE TABLET BY MOUTH EVERY MORNING   OTEZLA 30 MG TABS Take 1  tablet by mouth 2 (two) times daily.   tamsulosin (FLOMAX) 0.4 MG CAPS capsule Take 0.4 mg by mouth daily.   venlafaxine XR (EFFEXOR-XR) 150 MG 24 hr capsule Take 150 mg by mouth at bedtime.   venlafaxine XR (EFFEXOR-XR) 75 MG 24 hr capsule Take 75 mg by mouth at bedtime.   [DISCONTINUED] meloxicam (MOBIC) 15 MG tablet Take 15 mg by mouth daily. (Patient not taking: Reported on 09/13/2022)   [DISCONTINUED] Semaglutide,0.25 or 0.5MG /DOS, (OZEMPIC, 0.25 OR 0.5 MG/DOSE,) 2 MG/1.5ML SOPN Inject into the skin. (Patient not taking: Reported on 09/13/2022)   No  facility-administered encounter medications on file as of 09/13/2022.     Review of Systems  Review of Systems  N/a Physical Exam  BP 120/60 (BP Location: Left Arm, Patient Position: Sitting, Cuff Size: Large)   Pulse 77   Temp 98.8 F (37.1 C) (Oral)   Ht 5\' 2"  (1.575 m)   Wt 191 lb 9.6 oz (86.9 kg)   SpO2 99% Comment: 3L oxygen pulsed (POC)  BMI 35.04 kg/m   Wt Readings from Last 5 Encounters:  09/13/22 191 lb 9.6 oz (86.9 kg)  07/08/22 192 lb (87.1 kg)  05/09/22 204 lb 12.8 oz (92.9 kg)  03/16/22 194 lb (88 kg)  03/03/22 196 lb 12.8 oz (89.3 kg)    BMI Readings from Last 5 Encounters:  09/13/22 35.04 kg/m  07/08/22 34.01 kg/m  05/09/22 36.28 kg/m  03/16/22 34.37 kg/m  03/03/22 36.00 kg/m     Physical Exam General: Well-appearing, sitting in a chair Eyes: EOMI, icterus Neck: Supple, no JVP appreciated sitting upright Pulmonary: Clear, normal work of breathing Cardiovascular: Warm, no edema Abdomen: Nondistended, bowel sounds present MSK: No synovitis, joint effusion Neuro: Normal gait, no weakness Psych: Normal mood, full affect   Assessment & Plan:    COPD: Gold B based on symptoms, rare exacerbations (last 11/2021). Benefiting from roflumilast based on lack of exacerbations. Quit smoking 2010 after 40-pack-year history.  Emphysema on CT scans.  Continue Trelegy, levalbuterol nebulizer as well as HFA as  needed (avoiding albuterol given jitteriness, shakiness with albuterol).  Overall frequency exacerbations seem improved with Trelegy.  Trial of Breztri to see if this helps with baseline dyspnea.  If not, resume Trelegy.  If so can prescribe Breztri to replace Trelegy.  Chronic hypoxemic respiratory failure: In the setting of COPD, emphysema.  Oxygen saturation adequate on supplemental oxygen.  Pulmonary hypertension: Right heart catheterization in 2017 with mean PA 30, LVEDP 23, cardiac output 4.6, RA 16, PVR less than 2.  Likely primarily driven by group 2 disease given these findings.  Recommend adequate diuresis at the discretion of her cardiologist.  Possible group 3 disease is contributing as well, no ILD to target with inhaled therapies.  Return in about 6 months (around 03/13/2023).   Karren Burly, MD 09/13/2022

## 2022-10-18 ENCOUNTER — Encounter: Payer: Self-pay | Admitting: Pulmonary Disease

## 2022-11-10 ENCOUNTER — Ambulatory Visit: Payer: Self-pay | Admitting: Cardiology

## 2022-12-20 ENCOUNTER — Encounter: Payer: Self-pay | Admitting: Cardiology

## 2022-12-20 ENCOUNTER — Ambulatory Visit: Payer: BC Managed Care – PPO | Attending: Cardiology | Admitting: Cardiology

## 2022-12-20 VITALS — BP 130/76 | HR 72 | Resp 16 | Ht 62.0 in | Wt 194.4 lb

## 2022-12-20 DIAGNOSIS — Z9981 Dependence on supplemental oxygen: Secondary | ICD-10-CM

## 2022-12-20 DIAGNOSIS — E785 Hyperlipidemia, unspecified: Secondary | ICD-10-CM | POA: Diagnosis not present

## 2022-12-20 DIAGNOSIS — J449 Chronic obstructive pulmonary disease, unspecified: Secondary | ICD-10-CM

## 2022-12-20 DIAGNOSIS — I10 Essential (primary) hypertension: Secondary | ICD-10-CM | POA: Diagnosis not present

## 2022-12-20 DIAGNOSIS — Z87891 Personal history of nicotine dependence: Secondary | ICD-10-CM

## 2022-12-20 DIAGNOSIS — I700 Atherosclerosis of aorta: Secondary | ICD-10-CM

## 2022-12-20 NOTE — Patient Instructions (Addendum)
Medication Instructions:   Your physician recommends that you continue on your current medications as directed. Please refer to the Current Medication list given to you today.  *If you need a refill on your cardiac medications before your next appointment, please call your pharmacy*     Follow-Up: At [facility], you and your health needs are our priority.  As part of our continuing mission to provide you with exceptional heart care, we have created designated Provider Care Teams.  These Care Teams include your primary Cardiologist (physician) and Advanced Practice Providers (APPs -  Physician Assistants and Nurse Practitioners) who all work together to provide you with the care you need, when you need it.  We recommend signing up for the patient portal called "MyChart".  Sign up information is provided on this After Visit Summary.  MyChart is used to connect with patients for Virtual Visits (Telemedicine).  Patients are able to view lab/test results, encounter notes, upcoming appointments, etc.  Non-urgent messages can be sent to your provider as well.   To learn more about what you can do with MyChart, go to [email or web address].    Your next appointment:   1 year(s)  Provider:   Dr. Odis Hollingshead

## 2022-12-20 NOTE — Progress Notes (Signed)
Cardiology Office Note:  .   Date:  12/20/2022  ID:  Mckenzie Sullivan, DOB [birth year], MRN [id number] PCP:  Lance Bosch, NP  Former Cardiology Providers: Karleen Hampshire, PA; Dr. Laverta Baltimore (EP).  [facility] Providers Cardiologist:  None , [facility] (established care March 2023) Electrophysiologist:  None  Click to update primary MD,subspecialty MD or APP then REFRESH:1}    Chief Complaint  Patient presents with   Benign hypertension   Follow-up    History of Present Illness: Mckenzie Sullivan   Mckenzie Sullivan is a 62 y.o. Caucasian female whose past medical history and cardiovascular risk factors includes: HTN, dyslipidemia, diabetes mellitus type 2 (diet controlled), COPD requiring supplemental oxygen 2L at rest and 3 L with exertion, former smoker, atherosclerosis of the abdominal aorta.   After moving from [place] to [place] in March 2023 she establish care with our practice given her history of multiple cardiovascular risk factors.  She presents today for 16-month follow-up visit.  Over the last 6 months she denies any anginal chest pain or heart failure symptoms.  For reasons unknown she stopped taking her losartan hydrochlorothiazide and started noticing her home blood pressures reaching as high as 160 mmHg.  Less than a week ago she restarted taking losartan 25 mg p.o. daily and her home blood pressure readings have improved.  She is not on hydrochlorothiazide at this time.   Review of Systems: .   Review of Systems  Cardiovascular:  Negative for chest pain, claudication, irregular heartbeat, leg swelling, near-syncope, orthopnea, palpitations, paroxysmal nocturnal dyspnea and syncope.  Respiratory:  Negative for shortness of breath.   Hematologic/Lymphatic: Negative for bleeding problem.    Studies Reviewed:   EKG: EKG Interpretation Date/Time:  Tuesday December 20 2022 13:21:35 EST Ventricular Rate:  70 Text Interpretation: Normal sinus rhythm Possible Left atrial enlargement  No previous ECGs available Confirmed by Tessa Lerner [phone]) on 12/20/2022 1:24:12 PM  Echocardiogram: 03/15/2021: Left ventricle cavity is normal in size and wall thickness. Normal global wall motion. Normal LV systolic function with EF 65%. Normal diastolic filling pattern.  Mild (Grade I) aortic regurgitation. Normal right atrial pressure.   Cardiac catheterization 10/06/2015 (@ [facility]); RA 16, RV 48/10, PA 48/21, PAOP 20, CO 4.86, CI 5.4, LVEDP 23 mmHg. LVEF 60%. Mild nonobstructive CAD; 30% narrowing of the origin of the first diagonal branch.   RADIOLOGY: NA  Risk Assessment/Calculations:   NA   Labs:       Latest Ref Rng & Units 11/01/2021    6:36 AM 10/04/2021    2:30 PM 05/08/2021    3:04 PM  CBC  WBC 4.0 - 10.5 K/uL 11.3  11.7  12.4   Hemoglobin 12.0 - 15.0 g/dL 84.6  96.2  95.2   Hematocrit 36.0 - 46.0 % 37.2  36.4  41.3   Platelets 150 - 400 K/uL 381  356  458        Latest Ref Rng & Units 05/20/2022    1:01 PM 11/01/2021    6:36 AM 10/08/2021   10:34 AM  BMP  Glucose 70 - 99 mg/dL 841  324    BUN 8 - 27 mg/dL 13  14    Creatinine 4.01 - 1.00 mg/dL 0.27  2.53    BUN/Creat Ratio 12 - 28 15     Sodium 134 - 144 mmol/L 142  137    Potassium 3.5 - 5.2 mmol/L 4.6  2.8  3.2   Chloride 96 - 106  mmol/L 100  101    CO2 20 - 29 mmol/L 25  25    Calcium 8.7 - 10.3 mg/dL 65.7  9.2        Latest Ref Rng & Units 05/20/2022    1:01 PM 11/01/2021    6:36 AM 10/08/2021   10:34 AM  CMP  Glucose 70 - 99 mg/dL 846  962    BUN 8 - 27 mg/dL 13  14    Creatinine 9.52 - 1.00 mg/dL 8.41  3.24    Sodium 401 - 144 mmol/L 142  137    Potassium 3.5 - 5.2 mmol/L 4.6  2.8  3.2   Chloride 96 - 106 mmol/L 100  101    CO2 20 - 29 mmol/L 25  25    Calcium 8.7 - 10.3 mg/dL 02.7  9.2      No results found for: "CHOL", "HDL", "LDLCALC", "LDLDIRECT", "TRIG", "CHOLHDL" No results for input(s): "LIPOA" in the last 8760 hours. No components found for: "NTPROBNP" No  results for input(s): "PROBNP" in the last 8760 hours. No results for input(s): "TSH" in the last 8760 hours.  External Labs: Collected: May 10, 2022 provided by PCP. BUN 15, creatinine 0.76. Sodium 141, potassium 5.1, chloride 103, bicarb 23 AST 21, ALT 23, Alkaline phosphatase 131 (above normal limits), prior levels 127 in November 2023. Total cholesterol 196, triglycerides 214, LDL calculated 107, HDL 52, A1c 6.3. Vitamin D 9.0  Physical Exam:    Today's Vitals   12/20/22 1318  BP: 130/76  Pulse: 72  Resp: 16  SpO2: 97%  Weight: 194 lb 6.4 oz (88.2 kg)  Height: 5\' 2"  (1.575 m)   Body mass index is 35.56 kg/m. Wt Readings from Last 3 Encounters:  12/20/22 194 lb 6.4 oz (88.2 kg)  09/13/22 191 lb 9.6 oz (86.9 kg)  07/08/22 192 lb (87.1 kg)    Physical Exam  Constitutional: No distress.  Age appropriate, hemodynamically stable.   Neck: No JVD present.  Cardiovascular: Normal rate, regular rhythm, S1 normal, S2 normal, intact distal pulses and normal pulses. Exam reveals no gallop, no S3 and no S4.  No murmur heard. Pulmonary/Chest: No stridor. She has no wheezes. She has no rales.  Clear to auscultation bilaterally upper lung fields with decreased breath sounds at the bases. On [place] oxygen  Abdominal: Soft. Bowel sounds are normal. She exhibits no distension. There is no abdominal tenderness.  Musculoskeletal:        General: No edema.     Cervical back: Neck supple.  Neurological: She is alert and oriented to person, place, and time. She has intact cranial nerves (2-12).  Skin: Skin is warm and moist.     Impression & Recommendation(s):  Impression:   ICD-10-CM   1. Benign hypertension  I10 EKG 12-Lead    2. Dyslipidemia  E78.5     3. Chronic obstructive pulmonary disease, unspecified COPD type (HCC)  J44.9     4. Oxygen dependent  Z99.81     5. Atherosclerosis of aorta (HCC)  I70.0     6. Former smoker  Z87.891        Recommendation(s):  Benign  hypertension Office blood pressures are acceptable. Continue taking losartan 25 mg p.o. daily. If she starts noticing elevated blood pressures at home despite being on losartan either increase losartan to 50 mg p.o. daily or to restart hydrochlorothiazide.  If she starts hydrochlorothiazide I have asked her to take it in the morning and take losartan  at night.  If and when she does start hydrochlorothiazide she will need to have a BMP done to make sure potassium is within acceptable limits.  Dyslipidemia Currently on Lipitor.   She denies myalgia or other side effects. Currently managed by primary care provider.  Chronic obstructive pulmonary disease, unspecified COPD type (HCC) Oxygen dependent Former smoker Midwife with pulmonary medicine. Currently on 3 L nasal cannula oxygen.  Orders Placed:  Orders Placed This Encounter  Procedures   EKG 12-Lead   Final Medication List:   No orders of the defined types were placed in this encounter.   Medications Discontinued During This Encounter  Medication Reason   Budeson-Glycopyrrol-Formoterol (BREZTRI AEROSPHERE) 160-9-4.8 MCG/ACT AERO Patient Preference     Current Outpatient Medications:    atorvastatin (LIPITOR) 40 MG tablet, Take 40 mg by mouth daily., Disp: , Rfl:    estradiol (ESTRACE) 0.1 MG/GM vaginal cream, Place vaginally., Disp: , Rfl:    Fluticasone-Umeclidin-Vilant (TRELEGY ELLIPTA) 100-62.5-25 MCG/ACT AEPB, Inhale 1 puff into the lungs daily., Disp: 60 each, Rfl: 6   halobetasol (ULTRAVATE) 0.05 % cream, Apply topically., Disp: , Rfl:    levalbuterol (XOPENEX HFA) 45 MCG/ACT inhaler, Inhale into the lungs every 4 (four) hours as needed for wheezing., Disp: , Rfl:    losartan (COZAAR) 25 MG tablet, TAKE ONE TABLET BY MOUTH EVERY MORNING, Disp: 90 tablet, Rfl: 1   OTEZLA 30 MG TABS, Take 1 tablet by mouth 2 (two) times daily., Disp: , Rfl:    Venlafaxine HCl 225 MG TB24, Take 150 mg by mouth at bedtime., Disp: , Rfl:     venlafaxine XR (EFFEXOR-XR) 75 MG 24 hr capsule, Take 75 mg by mouth at bedtime., Disp: , Rfl:    tamsulosin (FLOMAX) 0.4 MG CAPS capsule, Take 0.4 mg by mouth daily. (Patient not taking: Reported on 12/20/2022), Disp: , Rfl:   Consent:   N/A  Disposition:   1 year follow-up sooner if needed  Patient may be asked to follow-up sooner based on the results of the above-mentioned testing.  Her questions and concerns were addressed to her satisfaction. She voices understanding of the recommendations provided during this encounter.    Signed, Tessa Lerner, DO, [facility]  [facility]  [address] [place], [place] [address] 12/20/2022 1:45 PM

## 2023-02-16 ENCOUNTER — Other Ambulatory Visit: Payer: Self-pay | Admitting: Pulmonary Disease

## 2023-03-07 IMAGING — DX DG ELBOW COMPLETE 3+V*R*
4 series · 4 of 4 positions shown · non-contrast
Comparison: None Available.

CLINICAL DATA: Pain and swelling

EXAM:
RIGHT ELBOW - COMPLETE 4 VIEW

[elbow ap]
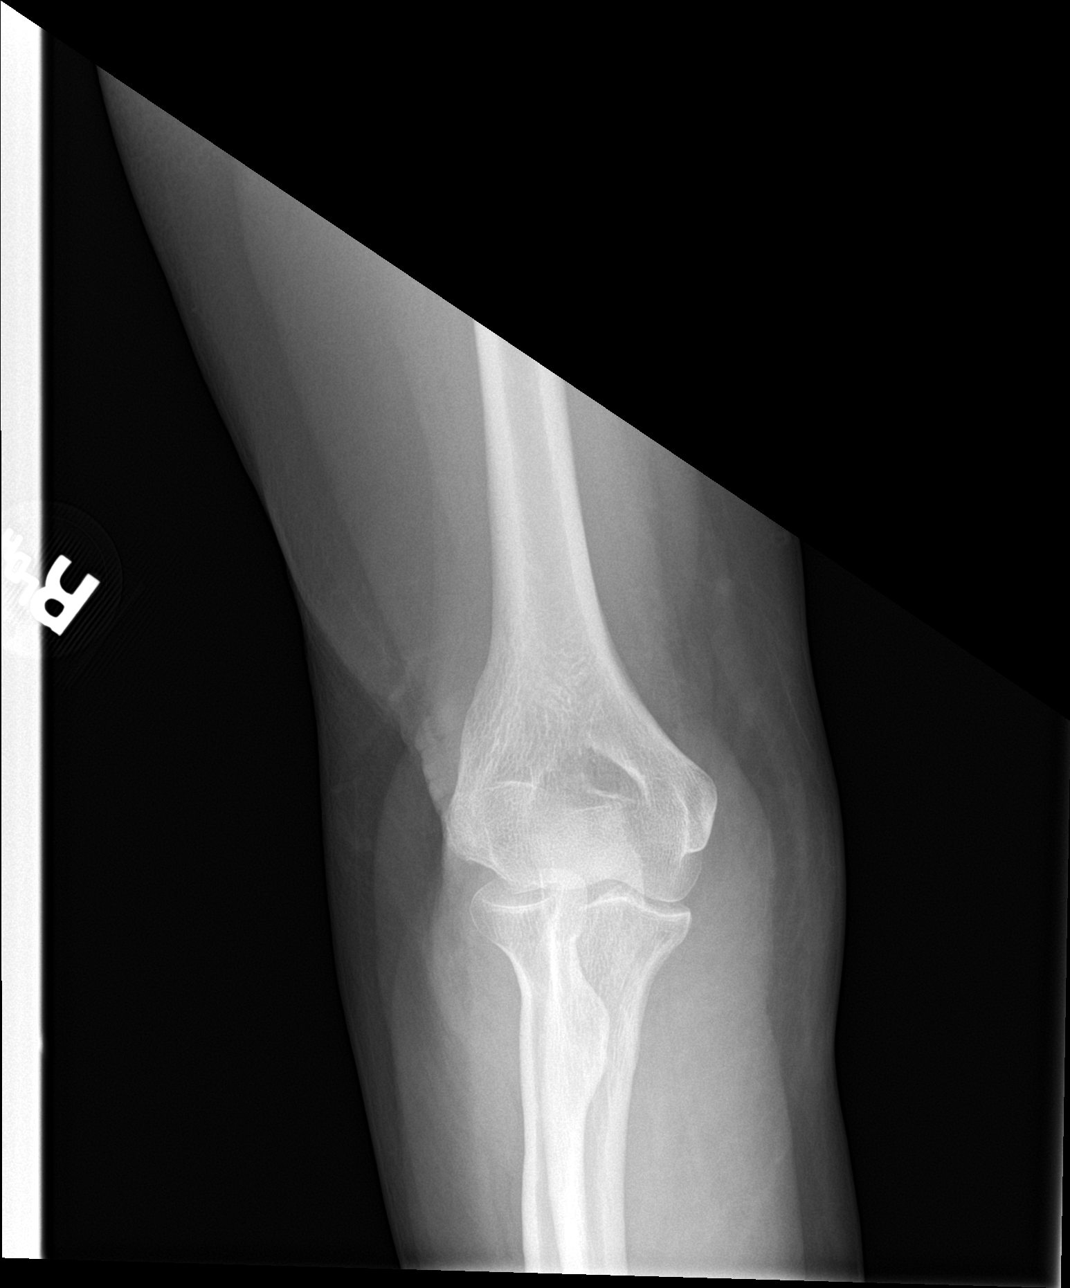

[elbow obl (1 of 2)]
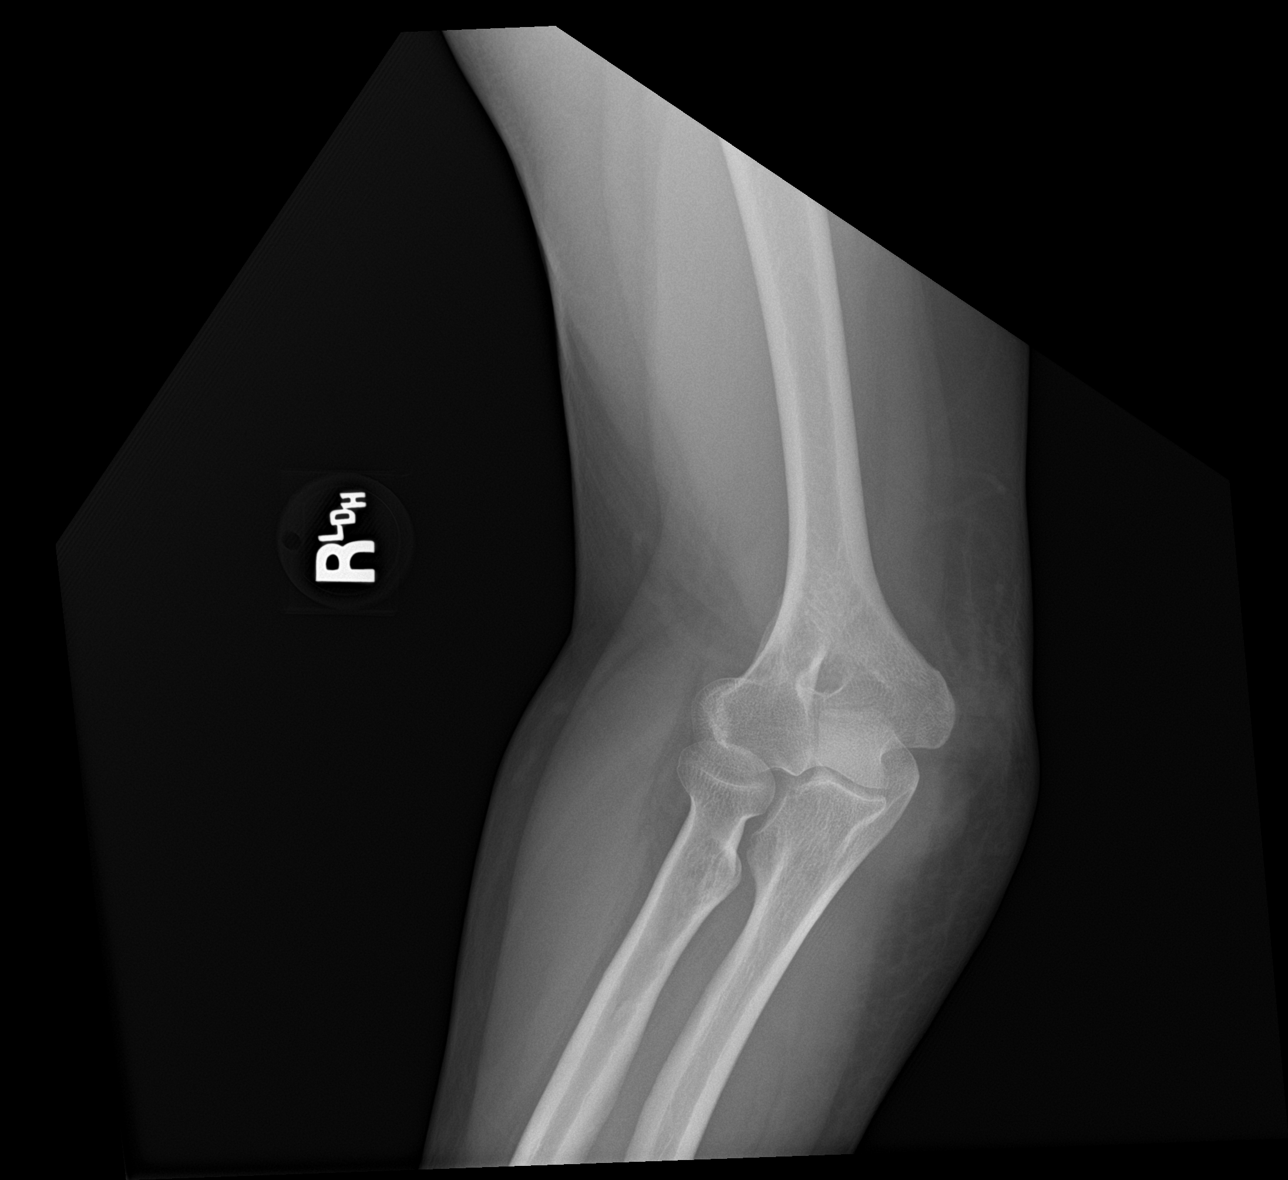

[elbow obl (2 of 2)]
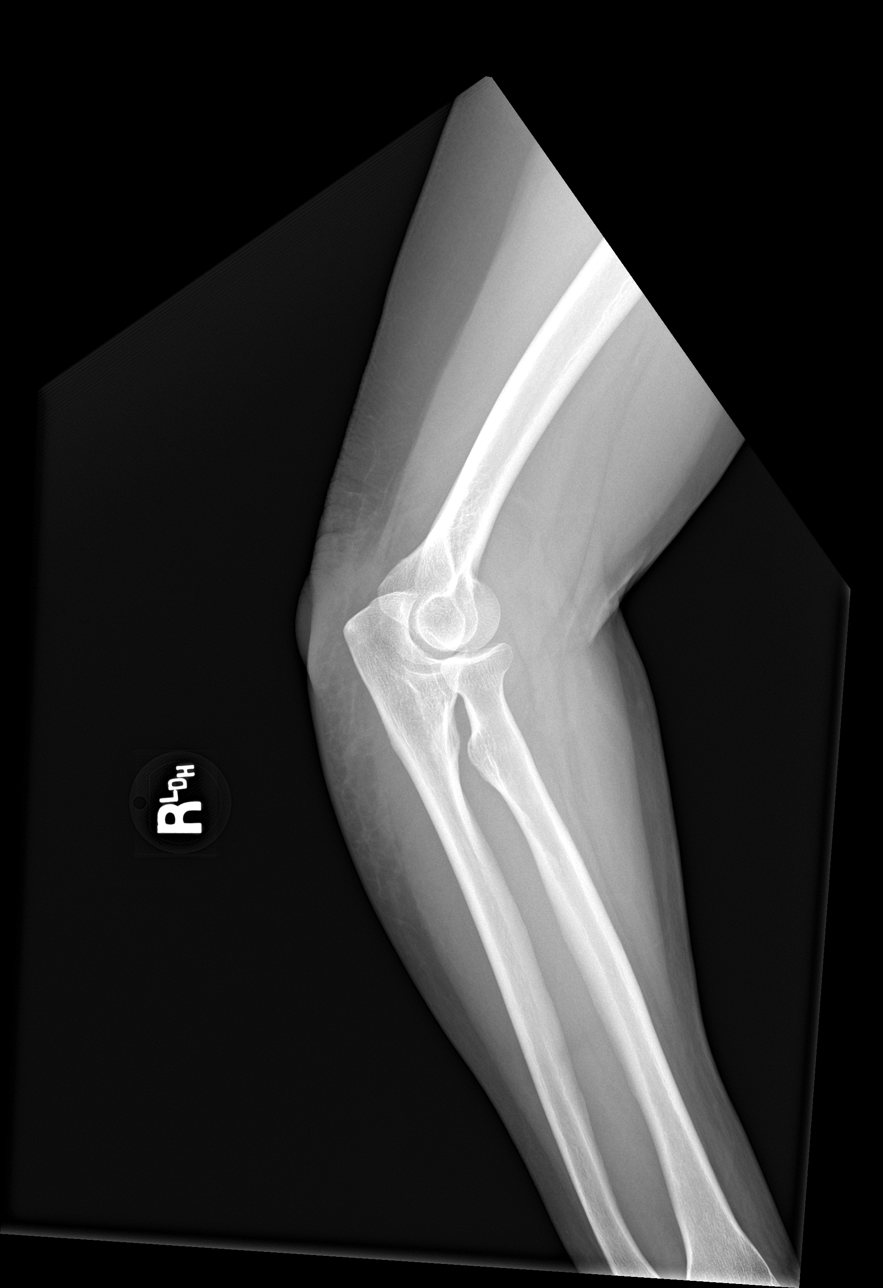

[elbow lat]
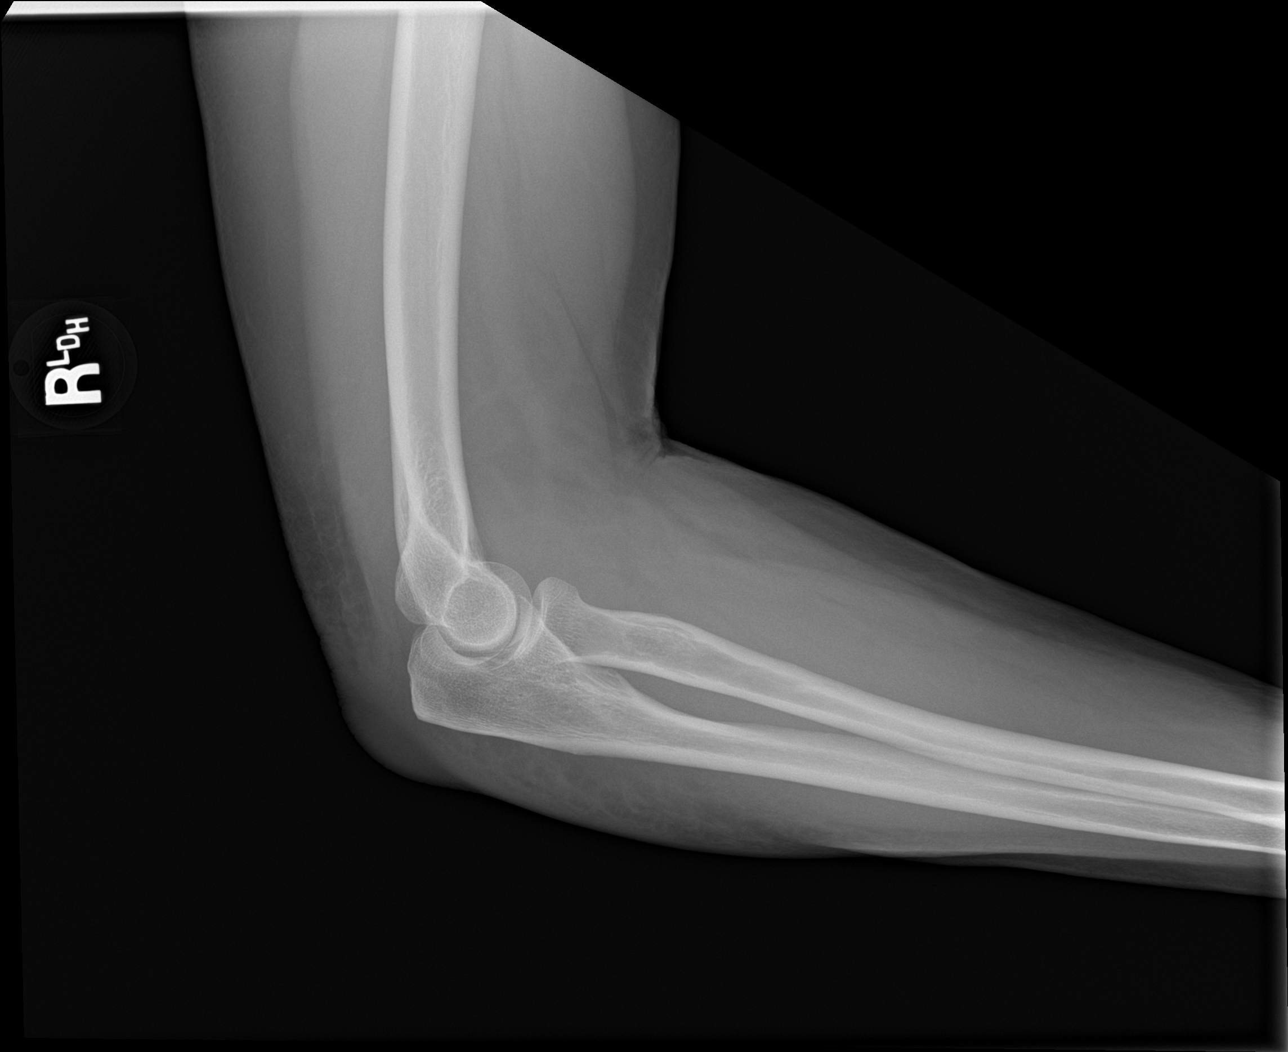

[4 of 4 positions shown; findings below may reference images not displayed]

FINDINGS: There is no evidence of fracture, dislocation, or joint effusion.
There is no evidence of arthropathy or other focal bone abnormality.
Soft tissue swelling.
IMPRESSION: No acute osseous abnormality.

## 2023-03-07 IMAGING — CT CT ELBOW*R* W/CM
3 series · 12 of 33 positions shown, 14 images · IV contrast (agent unspecified)
Comparison: X-ray 05/08/2021

CLINICAL DATA: Right elbow pain, swelling, redness. No known injury

EXAM:
CT OF THE UPPER RIGHT EXTREMITY WITH CONTRAST
TECHNIQUE: Multidetector CT imaging of the upper right extremity was performed
according to the standard protocol following intravenous contrast
administration.

[Series 3: rt elbow soft tissue · axial · 0.31mm/px · z∈[-820,-676]mm · 4 of 104 slices shown, 5 images]
[im 16/104  soft-tissue]
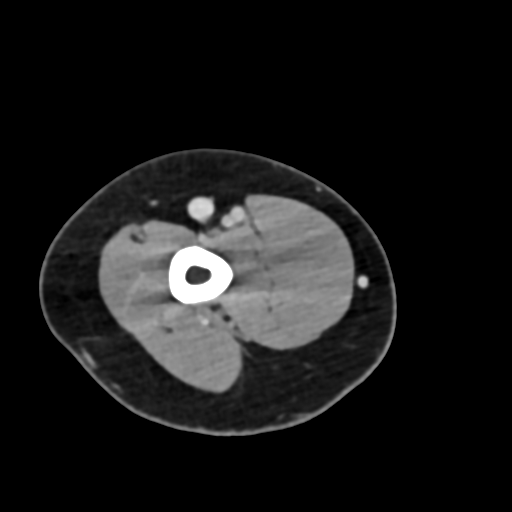
[im 16/104  bone]
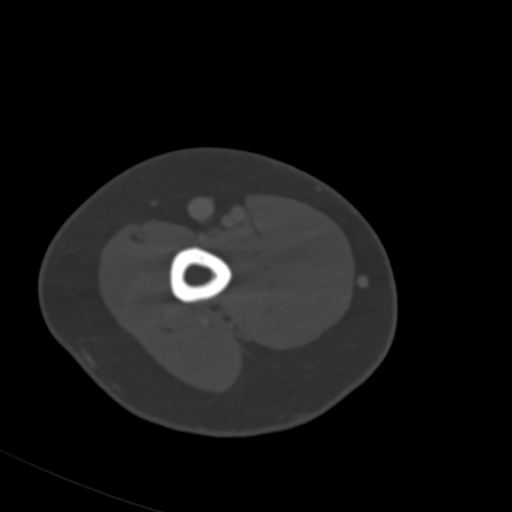
[im 40/104  bone]
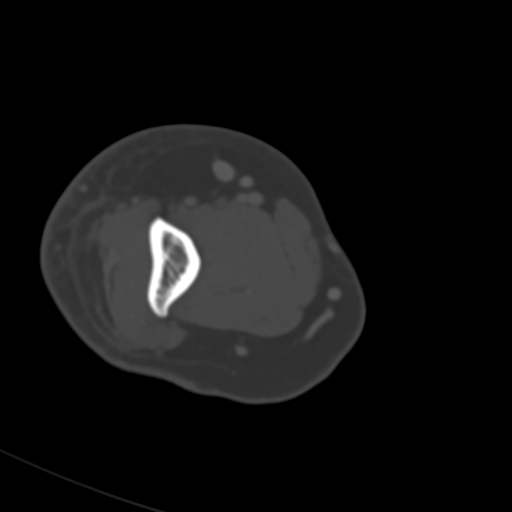
[im 64/104  bone]
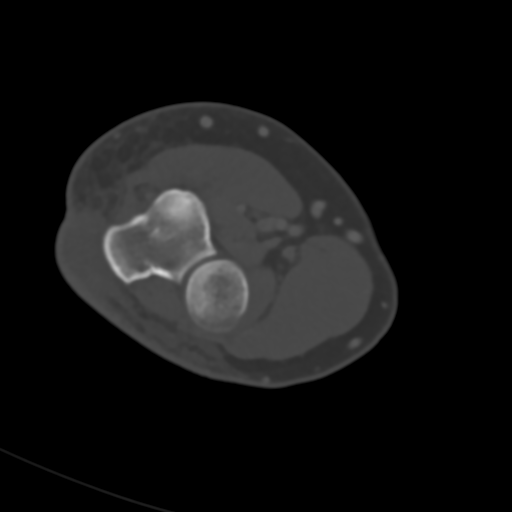
[im 88/104  bone]
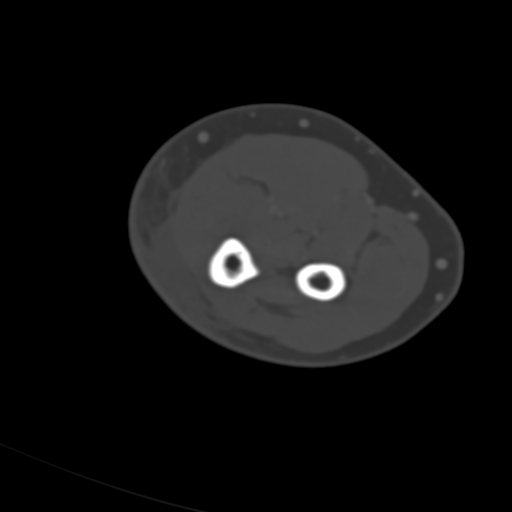

[Series 7: sag soft tissue · coronal · 0.33mm/px · 3 of 105 slices shown]
[im 21/105  bone]
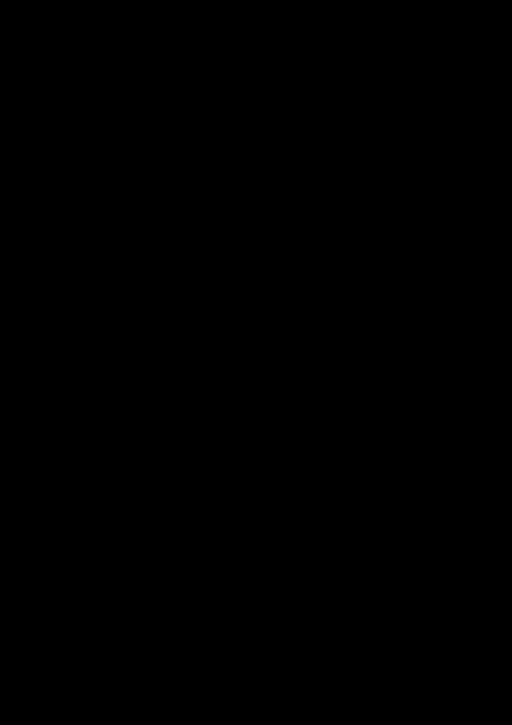
[im 42/105  bone]
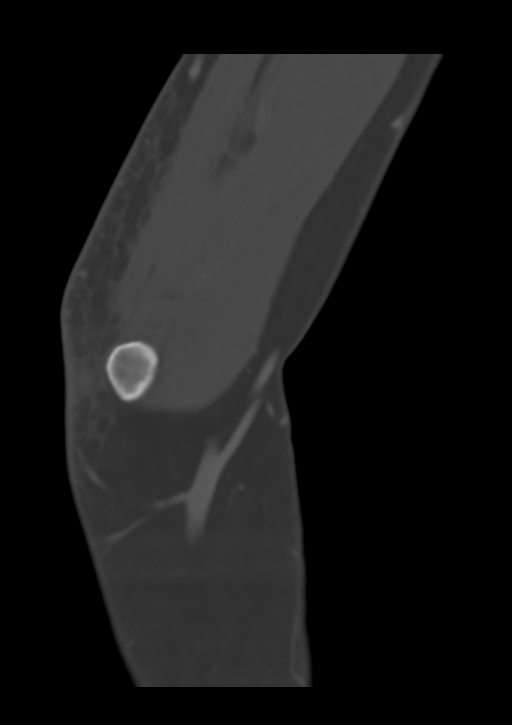
[im 63/105  bone]
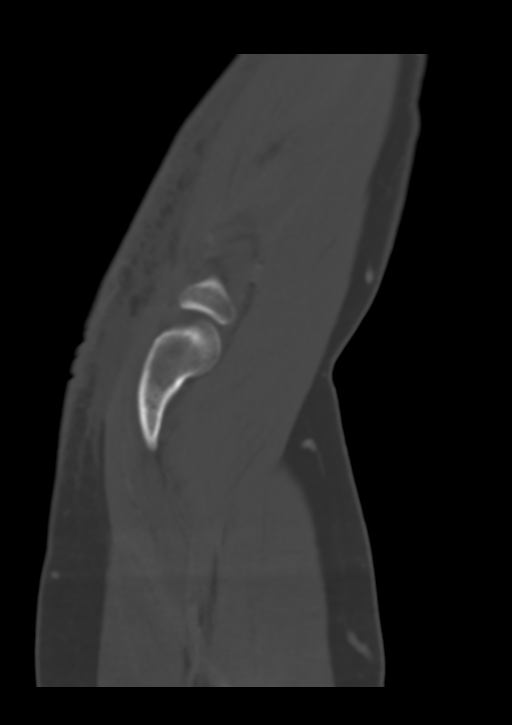

[Series 8: cor soft tissue · sagittal · 0.30mm/px · 5 of 78 slices shown, 6 images]
[im 26/78  bone]
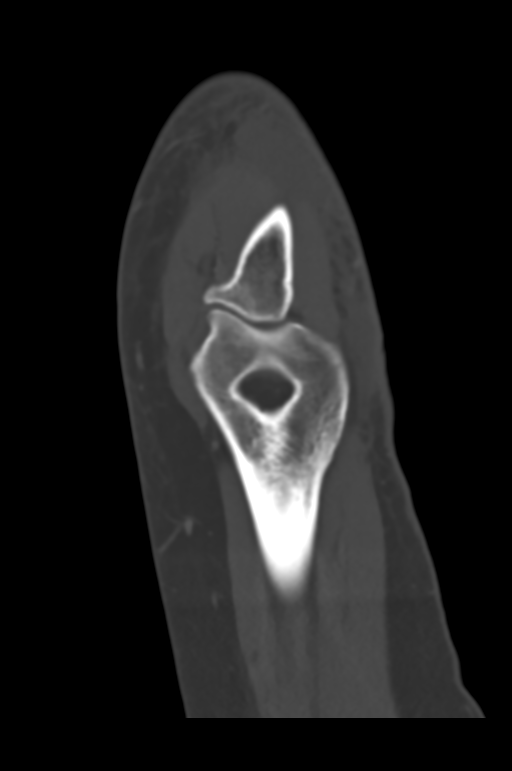
[im 33/78  bone]
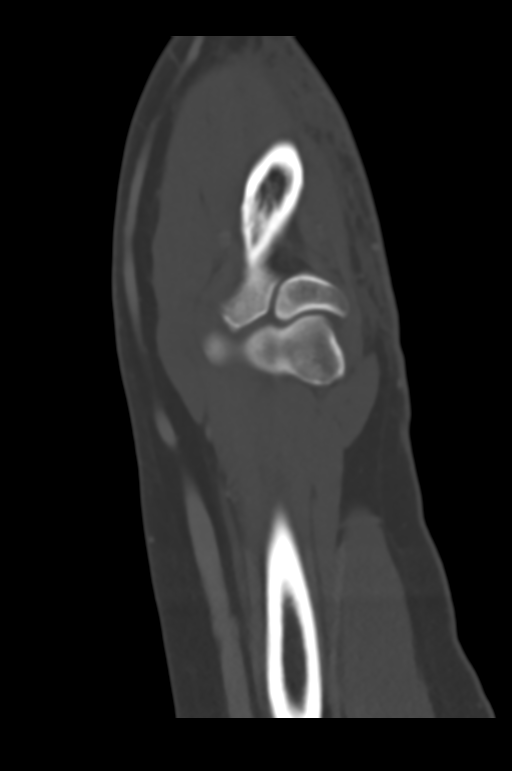
[im 39/78  soft-tissue]
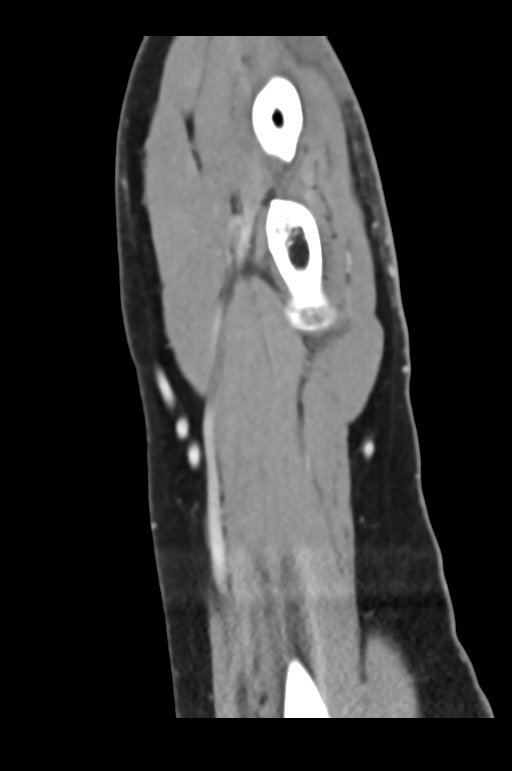
[im 39/78  bone]
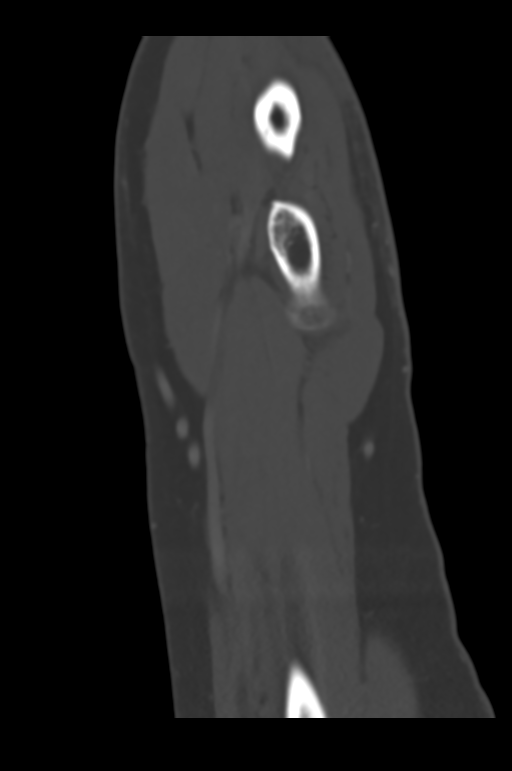
[im 45/78  bone]
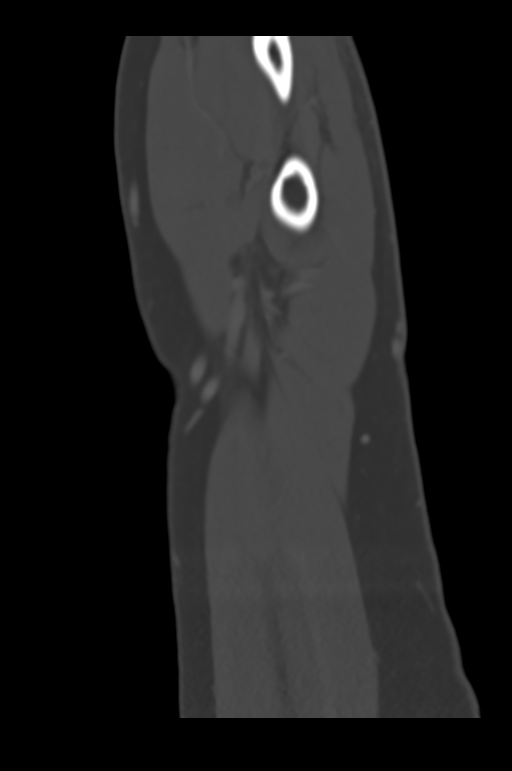
[im 52/78  bone]
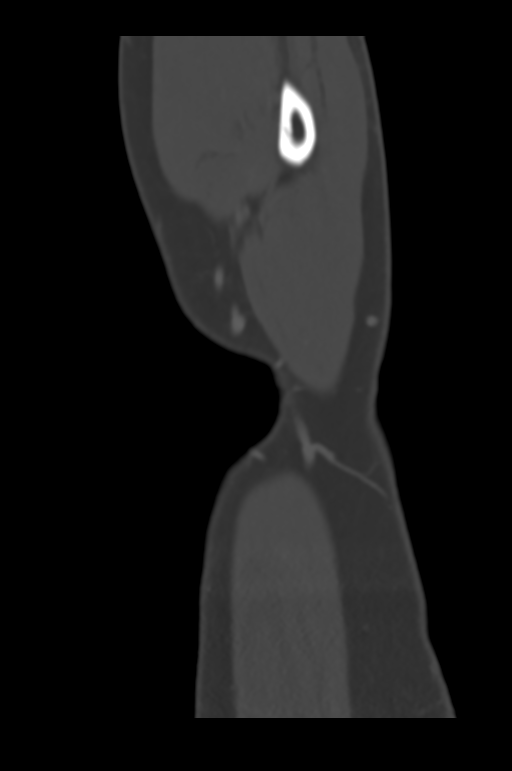

[12 of 33 positions shown; findings below may reference images not displayed]

RADIATION DOSE REDUCTION: This exam was performed according to the
departmental dose-optimization program which includes automated
exposure control, adjustment of the mA and/or kV according to
patient size and/or use of iterative reconstruction technique.

CONTRAST:  75mL OMNIPAQUE IOHEXOL 300 MG/ML  SOLN
FINDINGS: Bones/Joint/Cartilage

No acute fracture. No dislocation. No significant arthropathy of the
elbow. No elbow joint effusion. No bone erosion or periosteal
elevation. No lytic or sclerotic bone lesion.

Ligaments

Suboptimally assessed by CT.

Muscles and Tendons

Musculotendinous structures appear within normal limits by CT.
Distal triceps tendon intact. No intramuscular fluid collections are
evident.

Soft tissues

Partially rim enhancing fluid collection within the superficial soft
tissues overlying the olecranon process measures approximately 3.0 x
1.2 x 2.2 cm. There is soft tissue swelling with ill-defined fluid
within the surrounding soft tissues of the posterior elbow and
extending into the proximal forearm. No overlying skin wound is
evident. No soft tissue gas. No deep fascial edema or fluid
collections.
IMPRESSION: 1. Partially rim-enhancing fluid collection within the superficial
soft tissues overlying the olecranon process measuring 3.0 x 1.2 x
2.2 cm. Findings may represent olecranon bursitis, which could be
septic or aseptic, versus abscess.
2. Soft tissue swelling with ill-defined fluid within the
surrounding soft tissues of the posterior elbow and extending into
the proximal forearm, suggestive of cellulitis.
3. No evidence of acute osteomyelitis or septic arthritis of the
elbow.

## 2023-03-15 ENCOUNTER — Ambulatory Visit: Payer: BC Managed Care – PPO | Admitting: Pulmonary Disease

## 2023-03-15 ENCOUNTER — Encounter: Payer: Self-pay | Admitting: Pulmonary Disease

## 2023-03-15 VITALS — BP 142/60 | HR 79 | Temp 97.9°F | Ht 62.0 in | Wt 201.0 lb

## 2023-03-15 DIAGNOSIS — J9611 Chronic respiratory failure with hypoxia: Secondary | ICD-10-CM

## 2023-03-15 DIAGNOSIS — J432 Centrilobular emphysema: Secondary | ICD-10-CM

## 2023-03-15 DIAGNOSIS — I272 Pulmonary hypertension, unspecified: Secondary | ICD-10-CM

## 2023-03-15 DIAGNOSIS — R0609 Other forms of dyspnea: Secondary | ICD-10-CM

## 2023-03-15 DIAGNOSIS — J449 Chronic obstructive pulmonary disease, unspecified: Secondary | ICD-10-CM | POA: Diagnosis not present

## 2023-03-15 NOTE — Progress Notes (Signed)
 @Patient  ID: Mckenzie Sullivan, female    DOB: September 07, 1960, 63 y.o.   MRN: [id number]  Chief Complaint  Patient presents with   Follow-up    Doing well.  Needs meds refilled    Referring provider: Lance Bosch, NP  HPI:   63 y.o. woman whom we are seeing in follow up for evaluation of COPD and chronic hypoxemic respiratory failure.  Most recent cardiology note reviewed.  Overall doing well.  Stable.  No exacerbations.  Today he has a low-dose Trelegy.  Using lev albuterol prior to exertion.  We discussed continuing the strategy.  Seem to be doing okay.  Using 2 L nasal cannula pulsed today.   HPI at initial visit: Patient was in normal state of health in 2017.  She went for surgery for kidney stones.  When she was awakened from anesthesia she had hypoxemia.  She is placed on oxygen.  Has been on oxygen ever since.  Often with dyspnea started at that time.  Not really dyspneic prior to that.  Did not know had an issue with oxygen.  She uses 3 L with exertion.  This is been relatively stable over the last few years.  She quit smoking in 2010.  She has a approximately 40 pack year smoking history prior to that.  Reviewed her most recent PFTs in 2022 that demonstrates mild to moderate fixed obstruction, moderate reduced DLCO.  Reviewed most recent cross-sectional imaging CT scan 2017 that demonstrated severe emphysema per report.  Reviewed recent CT kidney stone scan that does show signs of emphysematous changes in the lower lobes albeit mild.  Reviewed most recent echocardiogram 02/2021 that does not demonstrate or discuss any right ventricular or right-sided abnormality.  Reviewed most recent echocardiogram in [place] 03/2019 that revealed normal RV size and function, normal RA size, normal RA pressure, no significant tricuspid regurgitation to suggest pulmonary hypertension.  She uses Trelegy and albuterol as needed.  She thinks they help a little bit.  She is not sure albuterol makes her feel any  better, does give her the shakes.  She is currently enrolled in lung cancer screening with upcoming scan next week.  She denies any history of exacerbations of her underlying breathing or COPD requiring prednisone or hospitalization.  PMH: COPD, kidney stones Surgical history: Multiple lithotripsy, ureteral stent Family history: Mother history of pneumonia, no significant rest or illness in first relatives Social history: Former smoker, 40-pack-year, quit in 2010, lives in [address], moved to [place] from [place] in 2023 as husband got a new job at [employer] / Pulmonary Flowsheets:   ACT:      No data to display          MMRC:     No data to display          Epworth:      No data to display          Tests:   FENO:  No results found for: "NITRICOXIDE"  PFT:     No data to display          WALK:      No data to display          Imaging: Personally reviewed and as per EMR discussion this note No results found.  Lab Results: Personally reviewed CBC    Component Value Date/Time   WBC 11.3 (H) 11/01/2021 0636   RBC 4.43 11/01/2021 0636   HGB 11.8 (L) 11/01/2021 0636  HCT 37.2 11/01/2021 0636   PLT 381 11/01/2021 0636   MCV 84.0 11/01/2021 0636   MCH 26.6 11/01/2021 0636   MCHC 31.7 11/01/2021 0636   RDW 14.3 11/01/2021 0636   LYMPHSABS 2.4 05/08/2021 1504   MONOABS 0.9 05/08/2021 1504   EOSABS 0.2 05/08/2021 1504   BASOSABS 0.1 05/08/2021 1504    BMET    Component Value Date/Time   NA 142 05/20/2022 1301   K 4.6 05/20/2022 1301   CL 100 05/20/2022 1301   CO2 25 05/20/2022 1301   GLUCOSE 138 (H) 05/20/2022 1301   GLUCOSE 119 (H) 11/01/2021 0636   BUN 13 05/20/2022 1301   CREATININE 0.84 05/20/2022 1301   CALCIUM 10.1 05/20/2022 1301   GFRNONAA 59 (L) 11/01/2021 0636    BNP No results found for: "BNP"  ProBNP No results found for: "PROBNP"  Specialty Problems    None   Allergies  Allergen Reactions   Penicillins Itching   Cephalexin Other (See Comments)    Upset stomach    Immunization History  Administered Date(s) Administered   Influenza-Unspecified 11/05/2021    Past Medical History:  Diagnosis Date   Abdominal aortic atherosclerosis (HCC)    CHF (congestive heart failure) (HCC)    Complication of anesthesia 2017   problems with O2 sats   COPD (chronic obstructive pulmonary disease) (HCC)    Dyslipidemia    History of kidney stones    Hypertension    Kidney stones    Oxygen dependent    Pneumonia     Tobacco History: Social History   Tobacco Use  Smoking Status Former   Current packs/day: 0.00   Types: Cigarettes   Start date: 60   Quit date: 2010   Years since quitting: 15.2  Smokeless Tobacco Never   Counseling given: Not Answered   Continue to not smoke  Outpatient Encounter Medications as of 03/15/2023  Medication Sig   aspirin EC 81 MG tablet Take 81 mg by mouth daily. Swallow whole.   atorvastatin (LIPITOR) 40 MG tablet Take 40 mg by mouth daily.   estradiol (ESTRACE) 0.1 MG/GM vaginal cream Place vaginally.   halobetasol (ULTRAVATE) 0.05 % cream Apply topically.   levalbuterol (XOPENEX HFA) 45 MCG/ACT inhaler Inhale into the lungs every 4 (four) hours as needed for wheezing.   losartan (COZAAR) 25 MG tablet TAKE ONE TABLET BY MOUTH EVERY MORNING   OTEZLA 30 MG TABS Take 1 tablet by mouth 2 (two) times daily.   tamsulosin (FLOMAX) 0.4 MG CAPS capsule Take 0.4 mg by mouth daily.   TRELEGY ELLIPTA 100-62.5-25 MCG/ACT AEPB INHALE ONE PUFF BY MOUTH INTO THE LUNGS ONE TIME DAILY   Venlafaxine HCl 225 MG TB24 Take 150 mg by mouth at bedtime.   venlafaxine XR (EFFEXOR-XR) 75 MG 24 hr capsule Take 75 mg by mouth at bedtime.   No facility-administered encounter medications on file as of 03/15/2023.     Review of Systems  Review of Systems  N/a Physical Exam  BP (!) 142/60 (BP Location: Left Arm,  Patient Position: Sitting, Cuff Size: Large)   Pulse 79   Temp 97.9 F (36.6 C) (Oral)   Ht 5\' 2"  (1.575 m)   Wt 201 lb (91.2 kg)   SpO2 95% Comment: 3L pulsed oxygen (mini tank pulsed)  BMI 36.76 kg/m   Wt Readings from Last 5 Encounters:  03/15/23 201 lb (91.2 kg)  12/20/22 194 lb 6.4 oz (88.2 kg)  09/13/22 191 lb 9.6 oz (86.9 kg)  07/08/22 192 lb (87.1 kg)  05/09/22 204 lb 12.8 oz (92.9 kg)    BMI Readings from Last 5 Encounters:  03/15/23 36.76 kg/m  12/20/22 35.56 kg/m  09/13/22 35.04 kg/m  07/08/22 34.01 kg/m  05/09/22 36.28 kg/m     Physical Exam General: Well-appearing, sitting in a chair Eyes: EOMI, icterus Neck: Supple, no JVP appreciated sitting upright Pulmonary: Clear, distant, normal work of breathing Cardiovascular: Warm, no edema Abdomen: Nondistended, bowel sounds present MSK: No synovitis, joint effusion Neuro: Normal gait, no weakness Psych: Normal mood, full affect   Assessment & Plan:    COPD: Gold B based on symptoms, rare exacerbations (last 11/2021). Benefiting from roflumilast based on lack of exacerbations. Quit smoking 2010 after 40-pack-year history.  Emphysema on CT scans.  Continue Trelegy, levalbuterol nebulizer as well as HFA as needed (avoiding albuterol given jitteriness, shakiness with albuterol).  Overall frequency exacerbations seem improved with Trelegy.    Chronic hypoxemic respiratory failure: In the setting of COPD, emphysema.  Oxygen saturation adequate on supplemental oxygen, 3 L pulsed today.  Pulmonary hypertension: Right heart catheterization in 2017 with mean PA 30, LVEDP 23, cardiac output 4.6, RA 16, PVR less than 2.  Likely primarily driven by group 2 disease given these findings.  Recommend adequate diuresis at the discretion of her cardiologist.  Possible group 3 disease is contributing as well, no ILD to target with inhaled therapies.  Return in about 6 months (around 09/15/2023) for f/u Dr.  Judeth Horn.   Karren Burly, MD 03/15/2023

## 2023-03-15 NOTE — Patient Instructions (Addendum)
 Nice to see you again  No changes to medicines, keep doing what you're doing  Return to clinic in 6 months or sooner as needed

## 2023-04-23 ENCOUNTER — Encounter (HOSPITAL_COMMUNITY): Payer: Self-pay | Admitting: Emergency Medicine

## 2023-04-23 ENCOUNTER — Other Ambulatory Visit: Payer: Self-pay

## 2023-04-23 ENCOUNTER — Emergency Department (HOSPITAL_COMMUNITY)
Admission: EM | Admit: 2023-04-23 | Discharge: 2023-04-23 | Disposition: A | Discharge status: Home / Self Care | Attending: Emergency Medicine | Admitting: Emergency Medicine

## 2023-04-23 ENCOUNTER — Emergency Department (HOSPITAL_COMMUNITY)

## 2023-04-23 DIAGNOSIS — Z7982 Long term (current) use of aspirin: Secondary | ICD-10-CM | POA: Diagnosis not present

## 2023-04-23 DIAGNOSIS — D72829 Elevated white blood cell count, unspecified: Secondary | ICD-10-CM | POA: Diagnosis not present

## 2023-04-23 DIAGNOSIS — N132 Hydronephrosis with renal and ureteral calculous obstruction: Secondary | ICD-10-CM

## 2023-04-23 DIAGNOSIS — R109 Unspecified abdominal pain: Secondary | ICD-10-CM | POA: Diagnosis present

## 2023-04-23 LAB — URINALYSIS, ROUTINE W REFLEX MICROSCOPIC
Bilirubin Urine: NEGATIVE
Glucose, UA: NEGATIVE mg/dL
Ketones, ur: NEGATIVE mg/dL
Nitrite: NEGATIVE
Protein, ur: NEGATIVE mg/dL
Specific Gravity, Urine: 1.015 (ref 1.005–1.030)
pH: 7 (ref 5.0–8.0)

## 2023-04-23 LAB — CBC
HCT: 36.7 % (ref 36.0–46.0)
Hemoglobin: 11.8 g/dL — ABNORMAL LOW (ref 12.0–15.0)
MCH: 27.4 pg (ref 26.0–34.0)
MCHC: 32.2 g/dL (ref 30.0–36.0)
MCV: 85.2 fL (ref 80.0–100.0)
Platelets: 350 10*3/uL (ref 150–400)
RBC: 4.31 MIL/uL (ref 3.87–5.11)
RDW: 13.5 % (ref 11.5–15.5)
WBC: 12.7 10*3/uL — ABNORMAL HIGH (ref 4.0–10.5)
nRBC: 0 % (ref 0.0–0.2)

## 2023-04-23 LAB — COMPREHENSIVE METABOLIC PANEL WITH GFR
ALT: 16 U/L (ref 0–44)
AST: 20 U/L (ref 15–41)
Albumin: 3.9 g/dL (ref 3.5–5.0)
Alkaline Phosphatase: 105 U/L (ref 38–126)
Anion gap: 13 (ref 5–15)
BUN: 17 mg/dL (ref 8–23)
CO2: 25 mmol/L (ref 22–32)
Calcium: 10.1 mg/dL (ref 8.9–10.3)
Chloride: 103 mmol/L (ref 98–111)
Creatinine, Ser: 1.12 mg/dL — ABNORMAL HIGH (ref 0.44–1.00)
GFR, Estimated: 56 mL/min — ABNORMAL LOW (ref >60)
Glucose, Bld: 101 mg/dL — ABNORMAL HIGH (ref 70–99)
Potassium: 3.7 mmol/L (ref 3.5–5.1)
Sodium: 141 mmol/L (ref 135–145)
Total Bilirubin: 0.4 mg/dL (ref 0.0–1.2)
Total Protein: 7.6 g/dL (ref 6.5–8.1)

## 2023-04-23 MED ORDER — OXYCODONE-ACETAMINOPHEN 5-325 MG PO TABS
1.0000 | ORAL_TABLET | ORAL | Status: AC | PRN
  Administered 2023-04-23 (×2): 1 via ORAL
  Filled 2023-04-23 (×2): qty 1

## 2023-04-23 MED ORDER — ONDANSETRON 4 MG PO TBDP
4.0000 mg | ORAL_TABLET | Freq: Once | ORAL | Status: AC
  Administered 2023-04-23: 4 mg via ORAL
  Filled 2023-04-23: qty 1

## 2023-04-23 MED ORDER — HYDROMORPHONE HCL 1 MG/ML IJ SOLN
0.5000 mg | Freq: Once | INTRAMUSCULAR | Status: AC
  Administered 2023-04-23: 0.5 mg via INTRAVENOUS
  Filled 2023-04-23: qty 1

## 2023-04-23 MED ORDER — KETOROLAC TROMETHAMINE 15 MG/ML IJ SOLN
15.0000 mg | Freq: Once | INTRAMUSCULAR | Status: AC
  Administered 2023-04-23: 15 mg via INTRAVENOUS
  Filled 2023-04-23: qty 1

## 2023-04-23 MED ORDER — SODIUM CHLORIDE 0.9 % IV BOLUS
1000.0000 mL | Freq: Once | INTRAVENOUS | Status: AC
  Administered 2023-04-23: 1000 mL via INTRAVENOUS

## 2023-04-23 NOTE — Discharge Instructions (Signed)
 Your CT scan today shows that you have kidney stones on both the left and the right side, the left being worse.  Do not eat or drink after midnight tonight.  You will need to go to the urology office at 8 AM and call on your way over there.  There is a possibility of having surgery tomorrow.  If you develop new or worsening or uncontrolled pain, fever, vomiting, or any other new/concerning symptoms then return to the ER.

## 2023-04-23 NOTE — ED Provider Triage Note (Signed)
 Emergency Medicine Provider Triage Evaluation Note  Shirlee Whitmire , a 63 y.o. female  was evaluated in triage.  Pt complains of left flank pain.  Sudden onset.  Some shortness of breath.  Earlier in the week had some right flank pain hematuria thought she passed a kidney stone.  She has chronic hypoxic respiratory failure due to COPD.  No chest pain, shortness of breath.  No pain or swelling in lower legs.  No history of known aneurysm.  History of multiple recurrent kidney stones  Review of Systems  Positive: Flank pain Negative: Fever  Physical Exam  BP (!) 175/66 (BP Location: Left Arm)   Pulse 65   Temp 98.3 F (36.8 C) (Oral)   Resp 18   SpO2 100%  Gen:   Awake, no distress   Resp:  Normal effort  MSK:   Moves extremities without difficulty  Other:    Medical Decision Making  Medically screening exam initiated at 6:39 PM.  Appropriate orders placed.  Anyla Israelson was informed that the remainder of the evaluation will be completed by another provider, this initial triage assessment does not replace that evaluation, and the importance of remaining in the ED until their evaluation is complete.  Flank pain   Aurielle Slingerland A, PA-C 04/23/23 1840

## 2023-04-23 NOTE — ED Provider Notes (Signed)
 [place] EMERGENCY DEPARTMENT AT [facility] Provider Note   CSN: [id number] Arrival date & time: 04/23/23  1710     History  Chief Complaint  Patient presents with   Flank Pain    Mena Simonis is a 63 y.o. female.  HPI 63 year old female presents with left flank pain.  Symptoms started today.  She had moderate to severe pain.  Feels like prior kidney stones.  Last week about 6 days ago she developed pain on the right side that felt like a kidney stone but then it went away about 4 days ago.  She did notice a little bit of pink urine today.  No dysuria.  No fevers.  She was nauseated but no vomiting.  Pain is about a 6 right now.  She was given some oxycodone  in triage which partially helped.  Home Medications Prior to Admission medications   Medication Sig Start Date End Date Taking? Authorizing Provider  aspirin EC 81 MG tablet Take 81 mg by mouth daily. Swallow whole.   Yes [provider]  atorvastatin (LIPITOR) 40 MG tablet Take 40 mg by mouth daily. 04/17/22  Yes [provider]  levalbuterol  (XOPENEX  HFA) 45 MCG/ACT inhaler Inhale into the lungs every 4 (four) hours as needed for wheezing.   Yes [provider]  losartan  (COZAAR ) 25 MG tablet TAKE ONE TABLET BY MOUTH EVERY MORNING Patient taking differently: Take 25 mg by mouth daily as needed (Per Cardiologist). 06/20/22  Yes Tolia, Sunit, DO  OTEZLA 30 MG TABS Take 1 tablet by mouth 2 (two) times daily. 02/13/22  Yes [provider]  tamsulosin  (FLOMAX ) 0.4 MG CAPS capsule Take 0.4 mg by mouth daily. 01/31/22  Yes [provider]  TRELEGY ELLIPTA  100-62.5-25 MCG/ACT AEPB INHALE ONE PUFF BY MOUTH INTO THE LUNGS ONE TIME DAILY 02/17/23  Yes Hunsucker, Archer Kobs, MD  venlafaxine XR (EFFEXOR-XR) 150 MG 24 hr capsule Take 150 mg by mouth daily. Patient takes 150mg  + 75mg  to make 225mg  04/20/23  Yes [provider]  venlafaxine XR (EFFEXOR-XR) 75 MG 24 hr capsule Take  75 mg by mouth at bedtime. Patient takes 75mg  + 150mg  to make 225mg    Yes [provider]  estradiol (ESTRACE) 0.1 MG/GM vaginal cream Place vaginally. Patient not taking: Reported on 04/23/2023 11/18/21   [provider]  halobetasol (ULTRAVATE) 0.05 % cream Apply topically. Patient not taking: Reported on 04/23/2023 11/29/21   [provider]      Allergies    Penicillins and Cephalexin    Review of Systems   Review of Systems  Constitutional:  Negative for fever.  Gastrointestinal:  Positive for abdominal pain and nausea. Negative for vomiting.  Genitourinary:  Positive for flank pain and hematuria. Negative for dysuria.    Physical Exam Updated Vital Signs BP (!) 166/67 (BP Location: Left Arm)   Pulse 78   Temp 98 F (36.7 C) (Oral)   Resp 18   SpO2 95%  Physical Exam Vitals and nursing note reviewed.  Constitutional:      General: She is not in acute distress.    Appearance: She is well-developed. She is not ill-appearing or diaphoretic.     Comments: Resting comfortably.  HENT:     Head: Normocephalic and atraumatic.  Cardiovascular:     Rate and Rhythm: Normal rate and regular rhythm.     Heart sounds: Normal heart sounds.  Pulmonary:     Effort: Pulmonary effort is normal.  Breath sounds: Normal breath sounds.  Abdominal:     Palpations: Abdomen is soft.     Tenderness: There is abdominal tenderness in the left lower quadrant. There is right CVA tenderness and left CVA tenderness.     Comments: Left > Right CVA tenderness.  Skin:    General: Skin is warm and dry.  Neurological:     Mental Status: She is alert.     ED Results / Procedures / Treatments   Labs (all labs ordered are listed, but only abnormal results are displayed) Labs Reviewed  COMPREHENSIVE METABOLIC PANEL WITH GFR - Abnormal; Notable for the following components:      Result Value   Glucose, Bld 101 (*)    Creatinine, Ser 1.12 (*)    GFR, Estimated 56 (*)     All other components within normal limits  CBC - Abnormal; Notable for the following components:   WBC 12.7 (*)    Hemoglobin 11.8 (*)    All other components within normal limits  URINALYSIS, ROUTINE W REFLEX MICROSCOPIC - Abnormal; Notable for the following components:   APPearance HAZY (*)    Hgb urine dipstick SMALL (*)    Leukocytes,Ua MODERATE (*)    Bacteria, UA RARE (*)    All other components within normal limits    EKG None  Radiology CT Renal Stone Study Result Date: 04/23/2023 CLINICAL DATA:  Left-sided flank pain.  Hematuria. EXAM: CT ABDOMEN AND PELVIS WITHOUT CONTRAST TECHNIQUE: Multidetector CT imaging of the abdomen and pelvis was performed following the standard protocol without IV contrast. RADIATION DOSE REDUCTION: This exam was performed according to the departmental dose-optimization program which includes automated exposure control, adjustment of the mA and/or kV according to patient size and/or use of iterative reconstruction technique. COMPARISON:  CT 03/08/2022 FINDINGS: Lower chest: No acute abnormality. Hepatobiliary: Hepatic steatosis. Unremarkable gallbladder and biliary tree. Pancreas: Unremarkable. Spleen: Unremarkable. Adrenals/Urinary Tract: Normal adrenal glands. Marked bilateral left greater than right hydroureteronephrosis. 6 mm stone in the mid left ureter. Additional 1-2 mm stone distal to the larger left ureteral stone. 4 mm stone in the proximal right ureter. Additional nonobstructing punctate stones in both kidneys. Unremarkable bladder. Stomach/Bowel: Normal caliber large and small bowel without bowel wall thickening. Colonic diverticulosis without diverticulitis. Stomach is within normal limits. Vascular/Lymphatic: Aortic atherosclerosis. No enlarged abdominal or pelvic lymph nodes. Reproductive: Uterus and bilateral adnexa are unremarkable. Other: No free intraperitoneal fluid or air. Musculoskeletal: No acute fracture. IMPRESSION: 1. Marked bilateral  left greater than right hydroureteronephrosis. 6 mm stone in the mid LEFT ureter. Additional 1-2 mm stone distal to the larger LEFT ureteral stone. 4 mm stone in the proximal RIGHT ureter. 2. Additional nonobstructing punctate stones in both kidneys. 3. Hepatic steatosis. 4. Aortic Atherosclerosis (ICD10-I70.0). Electronically Signed   By: Rozell Cornet M.D.   On: 04/23/2023 19:55    Procedures Procedures    Medications Ordered in ED Medications  oxyCODONE -acetaminophen  (PERCOCET/ROXICET) 5-325 MG per tablet 1 tablet (1 tablet Oral Given 04/23/23 2141)  ondansetron  (ZOFRAN -ODT) disintegrating tablet 4 mg (4 mg Oral Given 04/23/23 1835)  sodium chloride  0.9 % bolus 1,000 mL (0 mLs Intravenous Stopped 04/23/23 2259)  HYDROmorphone  (DILAUDID ) injection 0.5 mg (0.5 mg Intravenous Given 04/23/23 2151)  ketorolac  (TORADOL ) 15 MG/ML injection 15 mg (15 mg Intravenous Given 04/23/23 2154)    ED Course/ Medical Decision Making/ A&P  Medical Decision Making Amount and/or Complexity of Data Reviewed External Data Reviewed: labs and notes. Labs: ordered.    Details: Mild leukocytosis. Mild bump in creatinine from baseline, up to 1.1 Radiology: independent interpretation performed.    Details: Bilateral hydronephrosis from stones.  Risk Prescription drug management.   Patient was given pain control here with Dilaudid  and Toradol  and her pain is a lot better.  CT shows bilateral stones, left worse than right with hydronephrosis.  Patient's case was discussed with Dr. Jarvis Mesa of urology.  Given pain control he is recommending that patient be seen at 8 AM at [facility] urology and be n.p.o. after midnight for likely procedure but does not need to have an emergently tonight.  She has a few leukocytes in her urine but no dysuria and I think infected stone is unlikely.  She feels well enough for discharge and states she has some leftover oxycodone  she can use.  Will give return  precautions but appears stable for discharge.        Final Clinical Impression(s) / ED Diagnoses Final diagnoses:  Ureteral stone with hydronephrosis    Rx / DC Orders ED Discharge Orders     None         Jerilynn Montenegro, MD 04/23/23 2302

## 2023-04-23 NOTE — ED Triage Notes (Signed)
 Pt reports left sided flank pain that started today. Pt reports hx of kidney stones. Pt reports hematuria.

## 2023-04-24 ENCOUNTER — Ambulatory Visit (HOSPITAL_COMMUNITY)
Admission: RE | Admit: 2023-04-24 | Discharge: 2023-04-24 | Disposition: A | Discharge status: Home / Self Care | Source: Ambulatory Visit | Attending: Urology | Admitting: Urology

## 2023-04-24 ENCOUNTER — Encounter (HOSPITAL_COMMUNITY)
Admission: RE | Disposition: A | Discharge status: Home / Self Care | Payer: Self-pay | Source: Ambulatory Visit | Attending: Urology

## 2023-04-24 ENCOUNTER — Inpatient Hospital Stay (HOSPITAL_COMMUNITY): Admitting: Anesthesiology

## 2023-04-24 ENCOUNTER — Inpatient Hospital Stay (HOSPITAL_COMMUNITY)

## 2023-04-24 ENCOUNTER — Encounter (HOSPITAL_COMMUNITY): Payer: Self-pay | Admitting: Urology

## 2023-04-24 ENCOUNTER — Other Ambulatory Visit: Payer: Self-pay | Admitting: Urology

## 2023-04-24 DIAGNOSIS — Z9981 Dependence on supplemental oxygen: Secondary | ICD-10-CM | POA: Diagnosis not present

## 2023-04-24 DIAGNOSIS — N201 Calculus of ureter: Secondary | ICD-10-CM | POA: Insufficient documentation

## 2023-04-24 DIAGNOSIS — I10 Essential (primary) hypertension: Secondary | ICD-10-CM | POA: Diagnosis not present

## 2023-04-24 DIAGNOSIS — Z87891 Personal history of nicotine dependence: Secondary | ICD-10-CM | POA: Insufficient documentation

## 2023-04-24 DIAGNOSIS — J449 Chronic obstructive pulmonary disease, unspecified: Secondary | ICD-10-CM | POA: Insufficient documentation

## 2023-04-24 DIAGNOSIS — Z7982 Long term (current) use of aspirin: Secondary | ICD-10-CM | POA: Diagnosis not present

## 2023-04-24 HISTORY — PX: CYSTOSCOPY W/ URETERAL STENT PLACEMENT: SHX1429

## 2023-04-24 SURGERY — CYSTOSCOPY, WITH RETROGRADE PYELOGRAM AND URETERAL STENT INSERTION
Anesthesia: General | Laterality: Bilateral

## 2023-04-24 MED ORDER — CHLORHEXIDINE GLUCONATE 0.12 % MT SOLN
15.0000 mL | Freq: Once | OROMUCOSAL | Status: AC
  Administered 2023-04-24: 15 mL via OROMUCOSAL

## 2023-04-24 MED ORDER — HYDROMORPHONE HCL 1 MG/ML IJ SOLN: 0.2500 mg | INTRAMUSCULAR | Status: DC | PRN

## 2023-04-24 MED ORDER — MIDAZOLAM HCL 5 MG/5ML IJ SOLN
INTRAMUSCULAR | Status: DC | PRN
  Administered 2023-04-24: 2 mg via INTRAVENOUS

## 2023-04-24 MED ORDER — ONDANSETRON HCL 4 MG/2ML IJ SOLN
INTRAMUSCULAR | Status: DC | PRN
  Administered 2023-04-24: 4 mg via INTRAVENOUS

## 2023-04-24 MED ORDER — ONDANSETRON HCL 4 MG/2ML IJ SOLN
INTRAMUSCULAR | Status: AC
  Filled 2023-04-24: qty 2

## 2023-04-24 MED ORDER — SODIUM CHLORIDE 0.9 % IV SOLN: 12.5000 mg | INTRAVENOUS | Status: DC | PRN

## 2023-04-24 MED ORDER — CIPROFLOXACIN IN D5W 400 MG/200ML IV SOLN
INTRAVENOUS | Status: AC
  Filled 2023-04-24: qty 200

## 2023-04-24 MED ORDER — PROPOFOL 10 MG/ML IV BOLUS
INTRAVENOUS | Status: DC | PRN
  Administered 2023-04-24: 200 mg via INTRAVENOUS

## 2023-04-24 MED ORDER — CIPROFLOXACIN IN D5W 400 MG/200ML IV SOLN
INTRAVENOUS | Status: DC | PRN
  Administered 2023-04-24: 400 mg via INTRAVENOUS

## 2023-04-24 MED ORDER — LACTATED RINGERS IV SOLN: INTRAVENOUS | Status: DC

## 2023-04-24 MED ORDER — OXYCODONE HCL 5 MG/5ML PO SOLN: 5.0000 mg | Freq: Once | ORAL | Status: DC | PRN

## 2023-04-24 MED ORDER — MIDAZOLAM HCL 2 MG/2ML IJ SOLN
INTRAMUSCULAR | Status: AC
  Filled 2023-04-24: qty 2

## 2023-04-24 MED ORDER — IOPAMIDOL (ISOVUE-300) INJECTION 61%
INTRAVENOUS | Status: DC | PRN
  Administered 2023-04-24: 14 mL

## 2023-04-24 MED ORDER — PROPOFOL 10 MG/ML IV BOLUS
INTRAVENOUS | Status: AC
  Filled 2023-04-24: qty 20

## 2023-04-24 MED ORDER — FENTANYL CITRATE (PF) 100 MCG/2ML IJ SOLN
INTRAMUSCULAR | Status: AC
  Filled 2023-04-24: qty 2

## 2023-04-24 MED ORDER — LIDOCAINE HCL (PF) 2 % IJ SOLN
INTRAMUSCULAR | Status: AC
  Filled 2023-04-24: qty 5

## 2023-04-24 MED ORDER — AMISULPRIDE (ANTIEMETIC) 5 MG/2ML IV SOLN: 10.0000 mg | Freq: Once | INTRAVENOUS | Status: DC | PRN

## 2023-04-24 MED ORDER — DEXAMETHASONE SODIUM PHOSPHATE 10 MG/ML IJ SOLN
INTRAMUSCULAR | Status: DC | PRN
  Administered 2023-04-24: 4 mg via INTRAVENOUS

## 2023-04-24 MED ORDER — TAMSULOSIN HCL 0.4 MG PO CAPS: 0.4000 mg | ORAL_CAPSULE | Freq: Every day | ORAL | 0 refills | Status: DC

## 2023-04-24 MED ORDER — FENTANYL CITRATE (PF) 100 MCG/2ML IJ SOLN
INTRAMUSCULAR | Status: DC | PRN
  Administered 2023-04-24: 25 ug via INTRAVENOUS

## 2023-04-24 MED ORDER — SODIUM CHLORIDE 0.9 % IR SOLN
Status: DC | PRN
  Administered 2023-04-24: 3000 mL via INTRAVESICAL

## 2023-04-24 MED ORDER — LIDOCAINE HCL (CARDIAC) PF 100 MG/5ML IV SOSY
PREFILLED_SYRINGE | INTRAVENOUS | Status: DC | PRN
Start: 2023-04-24 — End: 2023-04-24
  Administered 2023-04-24: 60 mg via INTRAVENOUS

## 2023-04-24 MED ORDER — METHOCARBAMOL 750 MG PO TABS: 750.0000 mg | ORAL_TABLET | Freq: Four times a day (QID) | ORAL | 0 refills | Status: AC

## 2023-04-24 MED ORDER — HYOSCYAMINE SULFATE 0.125 MG PO TBDP: 0.1250 mg | ORAL_TABLET | Freq: Four times a day (QID) | ORAL | 0 refills | Status: DC | PRN

## 2023-04-24 MED ORDER — OXYCODONE HCL 5 MG PO TABS: 5.0000 mg | ORAL_TABLET | Freq: Once | ORAL | Status: DC | PRN

## 2023-04-24 MED ORDER — ORAL CARE MOUTH RINSE: 15.0000 mL | Freq: Once | OROMUCOSAL | Status: AC

## 2023-04-24 MED ORDER — DEXAMETHASONE SODIUM PHOSPHATE 10 MG/ML IJ SOLN
INTRAMUSCULAR | Status: AC
  Filled 2023-04-24: qty 1

## 2023-04-24 SURGICAL SUPPLY — 17 items
BAG COUNTER SPONGE SURGICOUNT (BAG) IMPLANT
BAG URO CATCHER STRL LF (MISCELLANEOUS) ×1 IMPLANT
CATH URETL OPEN 5X70 (CATHETERS) ×1 IMPLANT
CLOTH BEACON ORANGE TIMEOUT ST (SAFETY) ×1 IMPLANT
GLOVE SURG LX STRL 8.0 MICRO (GLOVE) ×1 IMPLANT
GOWN STRL REUS W/ TWL XL LVL3 (GOWN DISPOSABLE) ×1 IMPLANT
GUIDEWIRE STR DUAL SENSOR (WIRE) ×1 IMPLANT
KIT TURNOVER KIT A (KITS) IMPLANT
LASER FIB FLEXIVA PULSE ID 365 (Laser) IMPLANT
LASER FIB FLEXIVA PULSE ID 550 (Laser) IMPLANT
LASER FIB FLEXIVA PULSE ID 910 (Laser) IMPLANT
MANIFOLD NEPTUNE II (INSTRUMENTS) ×1 IMPLANT
NS IRRIG 1000ML POUR BTL (IV SOLUTION) IMPLANT
PACK CYSTO (CUSTOM PROCEDURE TRAY) ×1 IMPLANT
STENT POLARIS LOOP 6FR X 24 CM (STENTS) IMPLANT
TRACTIP FLEXIVA PULS ID 200XHI (Laser) IMPLANT
TUBING CONNECTING 10 (TUBING) ×1 IMPLANT

## 2023-04-24 NOTE — Discharge Instructions (Addendum)
 DISCHARGE INSTRUCTIONS FOR Ureteroscopy and/or Ureteral Stent Placement  MEDICATIONS:  1.  Robaxin  2. Tamsulosin   3. Hyoscyamine   4. Methocarbamol    ACTIVITY:  1. No strenuous activity x 1week  2. No driving while on narcotic pain medications  3. Drink plenty of water  4. Continue to walk at home - it is normal to see blood in the urine while the stent is in place, so keep active, but don't over do it.  5. May return to work/school tomorrow or when you feel ready  6. You may experience some pain when urinating in the kidney on the side that was operated on while the stent is in place this is normal  WHAT IS NORMAL TO EXPERIENCE: It is normal to feel the urge to urinate while the stent is in place It is normal to have blood in your urine while the stent is in place  It sometimes can be normal to have pain in your kidney when you urinate   BATHING:  1. You can shower and we recommend daily showers   DIET: You may return to your normal diet immediately. Because of the raw surface of your bladder, alcohol, spicy foods, acid type foods and drinks with caffeine may cause irritation or frequency and should be used in moderation. To keep your urine flowing freely and to avoid constipation, drink plenty of fluids during the day ( 8-10 glasses ). Tip: Avoid cranberry juice because it is very acidic.  SIGNS/SYMPTOMS TO CALL:  Please call us  if you have a fever greater than 101.5, uncontrolled nausea/vomiting, uncontrolled pain, dizziness, unable to urinate, bloody urine with clots greater than the size of a quarter, chest pain, shortness of breath, leg swelling, leg pain, redness around wound, drainage from wound, or any other concerns or questions.   You can reach us  at [phone].   FOLLOW-UP:  1. You will be called to set up follow up for ureteroscopy to remove the stones

## 2023-04-24 NOTE — H&P (Signed)
 63 year old female with a long history of nephrolithiasis presented to the ER last night with left flank pain CT demonstrated bilateral ureteral stones creatinine was 1.12. WBC 12.7 UA only demonstrating leukocytes. CT demonstrates a left 5 mm stone and a right 3 mm stone.   PMH: no blood thinners, Pt has Low O2 since 2017, previous smoker (COPD?), No Cars hx.   Bilateral ureteral stones:  04/24/23: had GH, had some pain left side. Nausea last night. last ate at midnight last night. No fevers or chills.       ALLERGIES: Cephalexin penicillin - Hives    MEDICATIONS: No Medications    Notes: Unknown and not listed in epic. The states she is on aspirin 81 mg but no other blood thinners.   GU PSH: Cysto Remove Stent FB Sim - 11/09/2021 Cystoscopy Insert Stent - 11/01/2021 ESWL - 2023, 2017 Ureteroscopic stone removal - 11/01/2021       PSH Notes: She has had ureteroscopy x3 but not ESWL   NON-GU PSH: No Non-GU PSH    GU PMH: Flank Pain - 03/08/2022, - 02/11/2022 Hydronephrosis - 02/11/2022, - 01/04/2022, - 12/31/2021 Ureteral calculus - 02/11/2022, - 01/18/2022, - 11/09/2021, - 10/18/2021, - 09/28/2021, - 2023, - 2023 Ureteral obstruction secondary to calculous - 02/11/2022, - 01/18/2022, - 01/04/2022, - 12/31/2021, - 10/18/2021, - 09/28/2021, - 2023      PMH Notes: COPD, CHF   NON-GU PMH: Arthritis Depression Diabetes Type 2 Hypercholesterolemia    FAMILY HISTORY: No Family History    SOCIAL HISTORY: Marital Status: Married Preferred Language: English; Race: White Current Smoking Status: Patient does not smoke anymore. Has not smoked since 03/03/2008. Smoked for 30 years.   Tobacco Use Assessment Completed: Used Tobacco in last 30 days? Has never drank.  Drinks 1 caffeinated drink per day. Has not had a blood transfusion.    REVIEW OF SYSTEMS:    GU Review Female:   Patient denies frequent urination, hard to postpone urination, burning /pain with urination, get up at night to urinate,  leakage of urine, stream starts and stops, trouble starting your stream, have to strain to urinate, and being pregnant.  Gastrointestinal (Upper):   Patient denies nausea, vomiting, and indigestion/ heartburn.  Gastrointestinal (Lower):   Patient denies diarrhea and constipation.  Constitutional:   Patient denies fever, night sweats, weight loss, and fatigue.  Skin:   Patient denies skin rash/ lesion and itching.  Eyes:   Patient denies blurred vision and double vision.  Ears/ Nose/ Throat:   Patient denies sore throat and sinus problems.  Hematologic/Lymphatic:   Patient denies swollen glands and easy bruising.  Cardiovascular:   Patient denies leg swelling and chest pains.  Respiratory:   Patient denies cough and shortness of breath.  Endocrine:   Patient denies excessive thirst.  Musculoskeletal:   Patient denies back pain and joint pain.  Neurological:   Patient denies headaches and dizziness.  Psychologic:   Patient denies depression and anxiety.   VITAL SIGNS:      04/24/2023 08:37 AM  BP 136/67 mmHg  Pulse 73 /min  Temperature 97.5 F / 36.3 C   MULTI-SYSTEM PHYSICAL EXAMINATION:    Constitutional: Well-nourished. No physical deformities. Normally developed. Good grooming.  Respiratory: Patient on chronic oxygen.  Cardiovascular: Normal temperature, normal extremity pulses, no swelling, no varicosities.     Complexity of Data:  Source Of History:  Patient  Records Review:   Previous Patient Records  Urine Test Review:   Urinalysis  X-Ray Review:  C.T. Abdomen/Pelvis: Reviewed Films. Reviewed Report. Bilateral ureteral stones with bilateral hydronephrosis no bladder stones no other GU abnormalities.    PROCEDURES:          Visit Complexity - G2211          Urinalysis w/Scope Dipstick Dipstick Cont'd Micro  Color: Yellow Bilirubin: Neg mg/dL WBC/hpf: 6 - 69/GEX  Appearance: Slightly Cloudy Ketones: Neg mg/dL RBC/hpf: 0 - 2/hpf  Specific Gravity: 1.030 Blood: 2+ ery/uL  Bacteria: Mod (26-50/hpf)  pH: <=5.0 Protein: 1+ mg/dL Cystals: NS (Not Seen)  Glucose: Neg mg/dL Urobilinogen: 0.2 mg/dL Casts: NS (Not Seen)    Nitrites: Neg Trichomonas: Not Present    Leukocyte Esterase: 1+ leu/uL Mucous: Present      Epithelial Cells: 6 - 10/hpf      Yeast: NS (Not Seen)      Sperm: Not Present    Notes: mixed epithelial components    ASSESSMENT:      ICD-10 Details  1 GU:   Ureteral calculus - N20.1 Undiagnosed New Problem   PLAN:           Document Letter(s):  Created for Patient: Clinical Summary         Notes:   Bilateral ureteral stones: Discussed options we will plan for stent today message Dr. Parke Boll about definitive management after stents placed. Patient has not eaten since 12:00 last night.   Discussed risk benefits alternatives to procedure including bleeding patient demonstrating structures including ureter and urethra patient is agreeable to plan understands that he will be follow-up with surgery to remove stone and stents.

## 2023-04-24 NOTE — Transfer of Care (Signed)
 Immediate Anesthesia Transfer of Care Note  Patient: Mckenzie Sullivan  Procedure(s) Performed: CYSTOSCOPY, WITH RETROGRADE PYELOGRAM AND URETERAL STENT INSERTION (Bilateral)  Patient Location: PACU  Anesthesia Type:General  Level of Consciousness: awake and patient cooperative  Airway & Oxygen Therapy: Patient Spontanous Breathing and Patient connected to face mask oxygen  Post-op Assessment: Report given to RN and Post -op Vital signs reviewed and stable  Post vital signs: Reviewed and stable  Last Vitals:  Vitals Value Taken Time  BP 116/57 04/24/23 1232  Temp 36.2 C 04/24/23 1232  Pulse 69 04/24/23 1241  Resp 11 04/24/23 1241  SpO2 94 % 04/24/23 1241  Vitals shown include unfiled device data.  Last Pain:  Vitals:   04/24/23 1232  TempSrc:   PainSc: Asleep      Patients Stated Pain Goal: 2 (04/24/23 1007)  Complications: No notable events documented.

## 2023-04-24 NOTE — Anesthesia Preprocedure Evaluation (Signed)
 Anesthesia Evaluation  Patient identified by MRN, date of birth, ID band Patient awake    Reviewed: Allergy & Precautions, NPO status , Patient's Chart, lab work & pertinent test results  Airway Mallampati: II  TM Distance: >3 FB Neck ROM: Full    Dental no notable dental hx. (+) Chipped, Partial Upper, Partial Lower, Dental Advisory Given   Pulmonary COPD,  COPD inhaler and oxygen dependent, former smoker   Pulmonary exam normal breath sounds clear to auscultation       Cardiovascular hypertension, Pt. on medications Normal cardiovascular exam Rhythm:Regular Rate:Normal  03/2021 Echo Normal global  wall motion. Normal LV systolic function with EF 65%.    Neuro/Psych    GI/Hepatic   Endo/Other    Renal/GU Renal diseaseLab Results      Component                Value               Date                      CREATININE               0.94                10/04/2021                K                        3.2 (L)             10/08/2021                  Musculoskeletal   Abdominal  (+) + obese  Peds  Hematology Lab Results      Component                Value               Date                         HGB                      11.6 (L)            10/04/2021                HCT                      36.4                10/04/2021               PLT                      356                 10/04/2021              Anesthesia Other Findings   Reproductive/Obstetrics                             Anesthesia Physical Anesthesia Plan  ASA: 3  Anesthesia Plan: General   Post-op Pain Management:    Induction: Intravenous  PONV Risk Score and Plan: 3 and Treatment may vary due to age or medical condition, Midazolam , Ondansetron  and  Dexamethasone   Airway Management Planned: LMA  Additional Equipment: None  Intra-op Plan:   Post-operative Plan:   Informed Consent: I have reviewed the patients  History and Physical, chart, labs and discussed the procedure including the risks, benefits and alternatives for the proposed anesthesia with the patient or authorized representative who has indicated his/her understanding and acceptance.     Dental advisory given  Plan Discussed with:   Anesthesia Plan Comments:         Anesthesia Quick Evaluation

## 2023-04-24 NOTE — Op Note (Signed)
 Preoperative diagnosis: bilateral obstructing ureteral stone Postoperative diagnosis: Same  Procedures performed: Cystoscopy, bilateral retrograde pyelogram with interpretation, bilateral ureteral stent placement  Surgeon: Dr. Aimee Alf  Findings:  Normal bladder no tumors diverticuli or stones Significant debris over the left kidney after stenting Bilateral 6 x 24 double-J stents without strings placed without complication  Bilateral retrograde pyelogram interpretation: Left retrograde pyelogram demonstrating significantly distended collecting system consistent with CT right retrograde pyelogram demonstrates distended collecting system does not severe as the left.  Indication: Mckenzie Sullivan is a 63 y.o. patient with long history of nephrolithiasis has not been seen in several years presented to the ER last night with bilateral ureteral stones was sent into the clinic this morning. After reviewing the management options for treatment, he elected to proceed with the above surgical procedure(s). We have discussed the potential benefits and risks of the procedure, side effects of the proposed treatment, the likelihood of the patient achieving the goals of the procedure, and any potential problems that might occur during the procedure or recuperation. Informed consent has been obtained.  Procedure in detail: Patient was consented prior to being brought back to the operating room. He was then brought back to the operating room placed on the table in supine position. General anesthesia was then induced and endotracheal tube inserted. This then placed in dorsolithotomy position and prepped and draped in the routine sterile fashion. A timeout was held.  Using a 22.5 [place] cystoscope with a 30  lens, I gently passed the scope into the patient's urethra and into the bladder under visual guidance. A 360 cystoscopic evaluation was performed with no mucosal abnormalities, no tumors, and no foreign  bodies identified. I then passed a 0. 038 Sensor wire into the left ureteral orifice and into the left renal pelvis. I then slid a 5 [place] open-ended ureteral catheter over the wire and into the renal pelvis and removed the wire.  Next I injected 10 cc of contrast into the renal collecting system and performed a retrograde pyelogram. I then replaced the wire through the open-ended ureteral catheter and remove the catheter. I then slid a 24 cm x 6 French double-J ureteral stent over the wire and into the renal pelvis under fluoroscopic guidance. Once a nice curl was noted in the renal pelvis I advanced the distal end of the stent into the bladder before removing the wire completely. A final fluoroscopic image was obtained confirming the curl in the renal pelvis as well as a curl in the bladder.   The same exact procedure was then done to the patient's right side.  Bladder was then drained patient was then taken to the PACU in stable condition  Disposition: The patient returned to the PACU in stable condition.

## 2023-04-24 NOTE — Anesthesia Procedure Notes (Signed)
 Procedure Name: LMA Insertion Date/Time: 04/24/2023 12:02 PM  Performed by: Mervyn Ace, CRNAPre-anesthesia Checklist: Patient identified, Emergency Drugs available, Suction available, Patient being monitored and Timeout performed Patient Re-evaluated:Patient Re-evaluated prior to induction Oxygen Delivery Method: Circle system utilized Preoxygenation: Pre-oxygenation with 100% oxygen Induction Type: IV induction Ventilation: Mask ventilation without difficulty LMA: LMA inserted LMA Size: 4.0 Number of attempts: 1 Tube secured with: Tape Dental Injury: Teeth and Oropharynx as per pre-operative assessment

## 2023-04-25 ENCOUNTER — Encounter (HOSPITAL_COMMUNITY): Payer: Self-pay | Admitting: Urology

## 2023-04-25 NOTE — Anesthesia Postprocedure Evaluation (Signed)
 Anesthesia Post Note  Patient: Mckenzie Sullivan  Procedure(s) Performed: CYSTOSCOPY, WITH RETROGRADE PYELOGRAM AND URETERAL STENT INSERTION (Bilateral)     Patient location during evaluation: PACU Anesthesia Type: General Level of consciousness: awake and alert Pain management: pain level controlled Vital Signs Assessment: post-procedure vital signs reviewed and stable Respiratory status: spontaneous breathing, nonlabored ventilation and respiratory function stable Cardiovascular status: blood pressure returned to baseline and stable Postop Assessment: no apparent nausea or vomiting Anesthetic complications: no   No notable events documented.  Last Vitals:  Vitals:   04/24/23 1615 04/24/23 1625  BP: 134/62 (!) 148/62  Pulse: 78 76  Resp: 16   Temp:  36.8 C  SpO2: 99% 98%    Last Pain:  Vitals:   04/24/23 1639  TempSrc:   PainSc: 0-No pain                 Earvin Goldberg

## 2023-05-02 ENCOUNTER — Encounter (HOSPITAL_COMMUNITY): Payer: Self-pay | Admitting: Urology

## 2023-05-02 ENCOUNTER — Encounter (HOSPITAL_COMMUNITY)
Admission: RE | Admit: 2023-05-02 | Discharge: 2023-05-02 | Disposition: A | Discharge status: Home / Self Care | Source: Ambulatory Visit | Attending: Urology

## 2023-05-02 ENCOUNTER — Other Ambulatory Visit: Payer: Self-pay | Admitting: Urology

## 2023-05-02 ENCOUNTER — Other Ambulatory Visit: Payer: Self-pay

## 2023-05-02 VITALS — Ht 63.0 in | Wt 199.0 lb

## 2023-05-02 DIAGNOSIS — N289 Disorder of kidney and ureter, unspecified: Secondary | ICD-10-CM | POA: Diagnosis not present

## 2023-05-02 DIAGNOSIS — Z9981 Dependence on supplemental oxygen: Secondary | ICD-10-CM | POA: Insufficient documentation

## 2023-05-02 DIAGNOSIS — I2510 Atherosclerotic heart disease of native coronary artery without angina pectoris: Secondary | ICD-10-CM | POA: Insufficient documentation

## 2023-05-02 DIAGNOSIS — E66813 Obesity, class 3: Secondary | ICD-10-CM | POA: Diagnosis not present

## 2023-05-02 DIAGNOSIS — Z6835 Body mass index (BMI) 35.0-35.9, adult: Secondary | ICD-10-CM | POA: Diagnosis not present

## 2023-05-02 DIAGNOSIS — Z01812 Encounter for preprocedural laboratory examination: Secondary | ICD-10-CM | POA: Insufficient documentation

## 2023-05-02 DIAGNOSIS — E119 Type 2 diabetes mellitus without complications: Secondary | ICD-10-CM | POA: Diagnosis not present

## 2023-05-02 DIAGNOSIS — N201 Calculus of ureter: Secondary | ICD-10-CM | POA: Diagnosis present

## 2023-05-02 DIAGNOSIS — J449 Chronic obstructive pulmonary disease, unspecified: Secondary | ICD-10-CM | POA: Diagnosis not present

## 2023-05-02 DIAGNOSIS — I5032 Chronic diastolic (congestive) heart failure: Secondary | ICD-10-CM | POA: Insufficient documentation

## 2023-05-02 DIAGNOSIS — Z87891 Personal history of nicotine dependence: Secondary | ICD-10-CM | POA: Diagnosis not present

## 2023-05-02 DIAGNOSIS — R7303 Prediabetes: Secondary | ICD-10-CM | POA: Insufficient documentation

## 2023-05-02 DIAGNOSIS — I509 Heart failure, unspecified: Secondary | ICD-10-CM | POA: Diagnosis not present

## 2023-05-02 DIAGNOSIS — J440 Chronic obstructive pulmonary disease with acute lower respiratory infection: Secondary | ICD-10-CM | POA: Insufficient documentation

## 2023-05-02 DIAGNOSIS — I272 Pulmonary hypertension, unspecified: Secondary | ICD-10-CM | POA: Insufficient documentation

## 2023-05-02 DIAGNOSIS — F32A Depression, unspecified: Secondary | ICD-10-CM | POA: Insufficient documentation

## 2023-05-02 DIAGNOSIS — Z01818 Encounter for other preprocedural examination: Secondary | ICD-10-CM

## 2023-05-02 DIAGNOSIS — I110 Hypertensive heart disease with heart failure: Secondary | ICD-10-CM | POA: Diagnosis not present

## 2023-05-02 LAB — CBC
HCT: 40.4 % (ref 36.0–46.0)
Hemoglobin: 12.4 g/dL (ref 12.0–15.0)
MCH: 27.2 pg (ref 26.0–34.0)
MCHC: 30.7 g/dL (ref 30.0–36.0)
MCV: 88.6 fL (ref 80.0–100.0)
Platelets: 441 10*3/uL — ABNORMAL HIGH (ref 150–400)
RBC: 4.56 MIL/uL (ref 3.87–5.11)
RDW: 13.5 % (ref 11.5–15.5)
WBC: 12.6 10*3/uL — ABNORMAL HIGH (ref 4.0–10.5)
nRBC: 0 % (ref 0.0–0.2)

## 2023-05-02 LAB — BASIC METABOLIC PANEL WITH GFR
Anion gap: 12 (ref 5–15)
BUN: 17 mg/dL (ref 8–23)
CO2: 27 mmol/L (ref 22–32)
Calcium: 9.7 mg/dL (ref 8.9–10.3)
Chloride: 100 mmol/L (ref 98–111)
Creatinine, Ser: 0.7 mg/dL (ref 0.44–1.00)
GFR, Estimated: >60 mL/min (ref >60)
Glucose, Bld: 118 mg/dL — ABNORMAL HIGH (ref 70–99)
Potassium: 3.8 mmol/L (ref 3.5–5.1)
Sodium: 139 mmol/L (ref 135–145)

## 2023-05-02 LAB — HEMOGLOBIN A1C
Hgb A1c MFr Bld: 5.8 % — ABNORMAL HIGH (ref 4.8–5.6)
Mean Plasma Glucose: 119.76 mg/dL

## 2023-05-02 NOTE — Patient Instructions (Addendum)
 SURGICAL WAITING ROOM VISITATION  Patients having surgery or a procedure may have no more than 2 support people in the waiting area - these visitors may rotate.    Children under the age of 60 must have an adult with them who is not the patient.  Due to an increase in RSV and influenza rates and associated hospitalizations, children ages 73 and under may not visit patients in [facility] hospitals.  Visitors with respiratory illnesses are discouraged from visiting and should remain at home.  If the patient needs to stay at the hospital during part of their recovery, the visitor guidelines for inpatient rooms apply. Pre-op nurse will coordinate an appropriate time for 1 support person to accompany patient in pre-op.  This support person may not rotate.    Please refer to the [facility] website for the visitor guidelines for Inpatients (after your surgery is over and you are in a regular room).       Your procedure is scheduled on: 05/04/2023    Report to [facility] Main Entrance    Report to admitting at  0845 AM   Call this number if you have problems the morning of surgery [phone]   Do not eat food or drink liquids  :After Midnight.                If you have questions, please contact your surgeon's office.      Oral Hygiene is also important to reduce your risk of infection.                                    Remember - BRUSH YOUR TEETH THE MORNING OF SURGERY WITH YOUR REGULAR TOOTHPASTE  DENTURES WILL BE REMOVED PRIOR TO SURGERY PLEASE DO NOT APPLY "Poly grip" OR ADHESIVES!!!   Do NOT smoke after Midnight   Stop all vitamins and herbal supplements 7 days before surgery.   Take these medicines the morning of surgery with A SIP OF WATER:   Inhalers as usual and bring, flomax ,   DO NOT TAKE ANY ORAL DIABETIC MEDICATIONS DAY OF YOUR SURGERY  Bring CPAP mask and tubing day of surgery.                              You may not have any metal on your body  including hair pins, jewelry, and body piercing             Do not wear make-up, lotions, powders, perfumes/cologne, or deodorant  Do not wear nail polish including gel and S&S, artificial/acrylic nails, or any other type of covering on natural nails including finger and toenails. If you have artificial nails, gel coating, etc. that needs to be removed by a nail salon please have this removed prior to surgery or surgery may need to be canceled/ delayed if the surgeon/ anesthesia feels like they are unable to be safely monitored.   Do not shave  48 hours prior to surgery.               Men may shave face and neck.   Do not bring valuables to the hospital. [facility] IS NOT             RESPONSIBLE   FOR VALUABLES.   Contacts, glasses, dentures or bridgework may not be worn into surgery.   Bring small  overnight bag day of surgery.   DO NOT BRING YOUR HOME MEDICATIONS TO THE HOSPITAL. PHARMACY WILL DISPENSE MEDICATIONS LISTED ON YOUR MEDICATION LIST TO YOU DURING YOUR ADMISSION IN THE HOSPITAL!    Patients discharged on the day of surgery will not be allowed to drive home.  Someone NEEDS to stay with you for the first 24 hours after anesthesia.   Special Instructions: Bring a copy of your healthcare power of attorney and living will documents the day of surgery if you haven't scanned them before.              Please read over the following fact sheets you were given: IF YOU HAVE QUESTIONS ABOUT YOUR PRE-OP INSTRUCTIONS PLEASE CALL [phone]   If you received a COVID test during your pre-op visit  it is requested that you wear a mask when out in public, stay away from anyone that may not be feeling well and notify your surgeon if you develop symptoms. If you test positive for Covid or have been in contact with anyone that has tested positive in the last 10 days please notify you surgeon.    [facility] - Preparing for Surgery Before surgery, you can play an important role.  Because skin  is not sterile, your skin needs to be as free of germs as possible.  You can reduce the number of germs on your skin by washing with CHG (chlorahexidine gluconate) soap before surgery.  CHG is an antiseptic cleaner which kills germs and bonds with the skin to continue killing germs even after washing. Please DO NOT use if you have an allergy to CHG or antibacterial soaps.  If your skin becomes reddened/irritated stop using the CHG and inform your nurse when you arrive at Short Stay. Do not shave (including legs and underarms) for at least 48 hours prior to the first CHG shower.  You may shave your face/neck. Please follow these instructions carefully:  1.  Shower with CHG Soap the night before surgery and the  morning of Surgery.  2.  If you choose to wash your hair, wash your hair first as usual with your  normal  shampoo.  3.  After you shampoo, rinse your hair and body thoroughly to remove the  shampoo.                           4.  Use CHG as you would any other liquid soap.  You can apply chg directly  to the skin and wash                       Gently with a scrungie or clean washcloth.  5.  Apply the CHG Soap to your body ONLY FROM THE NECK DOWN.   Do not use on face/ open                           Wound or open sores. Avoid contact with eyes, ears mouth and genitals (private parts).                       Wash face,  Genitals (private parts) with your normal soap.             6.  Wash thoroughly, paying special attention to the area where your surgery  will be performed.  7.  Thoroughly rinse your body with warm  water from the neck down.  8.  DO NOT shower/wash with your normal soap after using and rinsing off  the CHG Soap.                9.  Pat yourself dry with a clean towel.            10.  Wear clean pajamas.            11.  Place clean sheets on your bed the night of your first shower and do not  sleep with pets. Day of Surgery : Do not apply any lotions/deodorants the morning of  surgery.  Please wear clean clothes to the hospital/surgery center.  FAILURE TO FOLLOW THESE INSTRUCTIONS MAY RESULT IN THE CANCELLATION OF YOUR SURGERY PATIENT SIGNATURE_________________________________  NURSE SIGNATURE__________________________________  ________________________________________________________________________

## 2023-05-02 NOTE — Progress Notes (Addendum)
 Anesthesia Review:  PCP: andrea blevins,NP   Pulm- Matthew Hunsucker LOV 03/15/23  Card- Tolia- LOV 12/20/22   PPM/ ICD: Device Orders: Rep Notified:  Chest x-ray : CT Chest- 07/10/22  EKG : 12/20/22  Echo : 03/16/21  Stress test: Cardiac Cath :   Activity level:  cannot do a flight of stairs without diffiuctly  Sleep Study/ CPAP : none  Fasting Blood Sugar :      / Checks Blood Sugar -- times a day:     DM- type 2-no meds checks glucose 1-2x per week  Hgba1c-05/02/23-5.8  Blood Thinner/ Instructions /Last Dose: ASA / Instructions/ Last Dose :    04/23/2023- To ED Discharged on 04/24/23 Cysto-04/24/23   04/23/2023- cbc and cmp in epic.  Labs of cbc and bmp and urine culture done on 05/02/23    Oxygen - 3L 24/7    CBC done 05/02/23 routed to DR [facility] on 05/02/23.    Preop phone call completed on 05/02/23.  Med hx and preop instructons completed via phone call.  PT then came to hospital to have cbc, bmp and urine culture completed on 05/02/23 at 1455pm.  PT was given copy of preop instructions along with hibiclens .  PT came on scooter with Oxygen at 3L.

## 2023-05-03 LAB — URINE CULTURE: Culture: NO GROWTH

## 2023-05-03 NOTE — H&P (Addendum)
 63 year old female with a long history of nephrolithiasis presented to the ER last night with left flank pain CT demonstrated bilateral ureteral stones creatinine was 1.12. WBC 12.7 UA only demonstrating leukocytes. CT demonstrates a left 5 mm stone and a right 3 mm stone.   PMH: no blood thinners, Pt has Low O2 since 2017, previous smoker (COPD?), No Cars hx.   Bilateral ureteral stones:  04/24/23: had GH, had some pain left side. Nausea last night. last ate at midnight last night. No fevers or chills.       ALLERGIES: Cephalexin penicillin - Hives    MEDICATIONS: No Medications    Notes: Unknown and not listed in epic. The states she is on aspirin 81 mg but no other blood thinners.   GU PSH: Cysto Remove Stent FB Sim - 11/09/2021 Cystoscopy Insert Stent - 11/01/2021 ESWL - 2023, 2017 Ureteroscopic stone removal - 11/01/2021       PSH Notes: She has had ureteroscopy x3 but not ESWL   NON-GU PSH: No Non-GU PSH    GU PMH: Flank Pain - 03/08/2022, - 02/11/2022 Hydronephrosis - 02/11/2022, - 01/04/2022, - 12/31/2021 Ureteral calculus - 02/11/2022, - 01/18/2022, - 11/09/2021, - 10/18/2021, - 09/28/2021, - 2023, - 2023 Ureteral obstruction secondary to calculous - 02/11/2022, - 01/18/2022, - 01/04/2022, - 12/31/2021, - 10/18/2021, - 09/28/2021, - 2023      PMH Notes: COPD, CHF   NON-GU PMH: Arthritis Depression Diabetes Type 2 Hypercholesterolemia    FAMILY HISTORY: No Family History    SOCIAL HISTORY: Marital Status: Married Preferred Language: English; Race: White Current Smoking Status: Patient does not smoke anymore. Has not smoked since 03/03/2008. Smoked for 30 years.   Tobacco Use Assessment Completed: Used Tobacco in last 30 days? Has never drank.  Drinks 1 caffeinated drink per day. Has not had a blood transfusion.    REVIEW OF SYSTEMS:    GU Review Female:   Patient denies frequent urination, hard to postpone urination, burning /pain with urination, get up at night to urinate,  leakage of urine, stream starts and stops, trouble starting your stream, have to strain to urinate, and being pregnant.  Gastrointestinal (Upper):   Patient denies nausea, vomiting, and indigestion/ heartburn.  Gastrointestinal (Lower):   Patient denies diarrhea and constipation.  Constitutional:   Patient denies fever, night sweats, weight loss, and fatigue.  Skin:   Patient denies skin rash/ lesion and itching.  Eyes:   Patient denies blurred vision and double vision.  Ears/ Nose/ Throat:   Patient denies sore throat and sinus problems.  Hematologic/Lymphatic:   Patient denies swollen glands and easy bruising.  Cardiovascular:   Patient denies leg swelling and chest pains.  Respiratory:   Patient denies cough and shortness of breath.  Endocrine:   Patient denies excessive thirst.  Musculoskeletal:   Patient denies back pain and joint pain.  Neurological:   Patient denies headaches and dizziness.  Psychologic:   Patient denies depression and anxiety.   VITAL SIGNS:      04/24/2023 08:37 AM  BP 136/67 mmHg  Pulse 73 /min  Temperature 97.5 F / 36.3 C   MULTI-SYSTEM PHYSICAL EXAMINATION:    Constitutional: Well-nourished. No physical deformities. Normally developed. Good grooming.  Respiratory: Patient on chronic oxygen.  Cardiovascular: Normal temperature, normal extremity pulses, no swelling, no varicosities.     Complexity of Data:  Source Of History:  Patient  Records Review:   Previous Patient Records  Urine Test Review:   Urinalysis  X-Ray Review:  C.T. Abdomen/Pelvis: Reviewed Films. Reviewed Report. Bilateral ureteral stones with bilateral hydronephrosis no bladder stones no other GU abnormalities.    PROCEDURES:          Visit Complexity - G2211          Urinalysis w/Scope Dipstick Dipstick Cont'd Micro  Color: Yellow Bilirubin: Neg mg/dL WBC/hpf: 6 - 41/LKG  Appearance: Slightly Cloudy Ketones: Neg mg/dL RBC/hpf: 0 - 2/hpf  Specific Gravity: 1.030 Blood: 2+ ery/uL  Bacteria: Mod (26-50/hpf)  pH: <=5.0 Protein: 1+ mg/dL Cystals: NS (Not Seen)  Glucose: Neg mg/dL Urobilinogen: 0.2 mg/dL Casts: NS (Not Seen)    Nitrites: Neg Trichomonas: Not Present    Leukocyte Esterase: 1+ leu/uL Mucous: Present      Epithelial Cells: 6 - 10/hpf      Yeast: NS (Not Seen)      Sperm: Not Present    Notes: mixed epithelial components    ASSESSMENT:      ICD-10 Details  1 GU:   Ureteral calculus - N20.1 Undiagnosed New Problem   PLAN:           Document Letter(s):  Created for Patient: Clinical Summary         Notes:   Bilateral ureteral stones: She is s/p stent on 4/21 here today fro bilateral URS.    .We discussed risk benefits alternatives to ureteroscopy.  This included bleeding infection and damage to surrounding structures surrounding structures including ureter as well as urethra.  We discussed the need for stent postoperatively as well as the potential symptoms of stent placement.  We discussed possible inability to complete procedure due to caliber of your ureter or inability to pass stone possibly requiring long-term stent versus nephrostomy tube.  We discussed need for possible second surgery.  Patient voiced their understanding and consent was obtained.

## 2023-05-03 NOTE — Anesthesia Preprocedure Evaluation (Addendum)
 Anesthesia Evaluation  Patient identified by MRN, date of birth, ID band Patient awake    Reviewed: Allergy & Precautions, H&P , NPO status , Patient's Chart, lab work & pertinent test results  Airway Mallampati: II  TM Distance: >3 FB Neck ROM: Full    Dental no notable dental hx. (+) Edentulous Upper, Dental Advisory Given   Pulmonary COPD,  COPD inhaler and oxygen dependent, former smoker   Pulmonary exam normal breath sounds clear to auscultation       Cardiovascular hypertension, Pt. on medications  Rhythm:Regular Rate:Normal     Neuro/Psych    Depression    negative neurological ROS     GI/Hepatic negative GI ROS, Neg liver ROS,,,  Endo/Other  diabetes  Class 3 obesity  Renal/GU Renal disease  negative genitourinary   Musculoskeletal   Abdominal   Peds  Hematology negative hematology ROS (+)   Anesthesia Other Findings   Reproductive/Obstetrics negative OB ROS                             Anesthesia Physical Anesthesia Plan  ASA: 3  Anesthesia Plan: General   Post-op Pain Management: Ofirmev  IV (intra-op)* and Toradol  IV (intra-op)*   Induction: Intravenous  PONV Risk Score and Plan: 4 or greater and Ondansetron , Dexamethasone  and Midazolam   Airway Management Planned: LMA  Additional Equipment:   Intra-op Plan:   Post-operative Plan: Extubation in OR  Informed Consent: I have reviewed the patients History and Physical, chart, labs and discussed the procedure including the risks, benefits and alternatives for the proposed anesthesia with the patient or authorized representative who has indicated his/her understanding and acceptance.     Dental advisory given  Plan Discussed with: CRNA  Anesthesia Plan Comments: (See PAT note from 4/29)        Anesthesia Quick Evaluation

## 2023-05-03 NOTE — Progress Notes (Signed)
 Case: [id number] Date/Time: 05/04/23 1045   Procedure: CYSTOSCOPY/URETEROSCOPY/HOLMIUM LASER/STENT PLACEMENT (Bilateral) - with bilateral retrograde pyleogram   Anesthesia type: General   Diagnosis: Calculus of ureter [N20.1]   Pre-op diagnosis: BILATERAL URETERAL STONES   [facility]OM 09 / [facility]   Surgeons: Thelbert Finner, MD       DISCUSSION: Mckenzie Sullivan is a 63 yo female who presents to PAT prior to surgery above. PMH of former smoking (quit 2010), HTN, CAD (mild, nonobstructive by cath in 2017), HFpEF, pulmonary HTN, COPD with chronic respiratory failure on 3L O2, prediabetes, kidney stones, depression.  Patient has had multiple surgeries for kidney stones. Most recently had bilateral stents placed on 4/21 by Dr. Cathi Cluster. Now scheduled for lithotripsy.  Patient follows with Pulm due to her COPD with chronic respiratory failure. Last seen by Dr. Marygrace Snellen on 03/15/22 and was noted to be doing well/stable. She uses inhalers. Uses inhalers/nebs. On 3L O2.  Pt follows with Cardiology for pulmonary HTN and HFpEF. Last seen by Dr. Albert Huff on 12/20/2022. Stable at that visit.  VS: Ht 5\' 3"  (1.6 m)   Wt 90.3 kg   BMI 35.25 kg/m   PROVIDERS: Armida Berry, NP   LABS: Labs reviewed: Acceptable for surgery. Chronic mild leukocytosis. (all labs ordered are listed, but only abnormal results are displayed)  Labs Reviewed  CBC - Abnormal; Notable for the following components:      Result Value   WBC 12.6 (*)    Platelets 441 (*)    All other components within normal limits  BASIC METABOLIC PANEL WITH GFR - Abnormal; Notable for the following components:   Glucose, Bld 118 (*)    All other components within normal limits  HEMOGLOBIN A1C - Abnormal; Notable for the following components:   Hgb A1c MFr Bld 5.8 (*)    All other components within normal limits  URINE CULTURE     IMAGES:   EKG:   CV:  Echo 03/15/2021:  Left ventricle cavity is normal in size and  wall thickness. Normal global  wall motion. Normal LV systolic function with EF 65%. Normal diastolic  filling pattern.  Mild (Grade I) aortic regurgitation.  Normal right atrial pressure.   Cardiac catheterization 10/06/2015 (@ [facility]);  RA 16, RV 48/10, PA 48/21, PAOP 20, CO 4.86, CI 5.4, LVEDP 23 mmHg. LVEF 60%. Mild nonobstructive CAD; 30% narrowing of the origin of the first diagonal branch.      Past Medical History:  Diagnosis Date   Abdominal aortic atherosclerosis (HCC)    CHF (congestive heart failure) (HCC)    Complication of anesthesia 2017   problems with O2 sats   COPD (chronic obstructive pulmonary disease) (HCC)    Depression    Diabetes mellitus without complication (HCC)    Dyslipidemia    History of kidney stones    Hypertension    Kidney stones    Oxygen dependent    Pneumonia     Past Surgical History:  Procedure Laterality Date   CARDIAC CATHETERIZATION     2017   CESAREAN SECTION     CYSTOSCOPY W/ URETERAL STENT PLACEMENT     has had numerous cystoscopies since 2017   CYSTOSCOPY W/ URETERAL STENT PLACEMENT Bilateral 04/24/2023   Procedure: CYSTOSCOPY, WITH RETROGRADE PYELOGRAM AND URETERAL STENT INSERTION;  Surgeon: Thelbert Finner, MD;  Location[facility];  Service: Urology;  Laterality: Bilateral;  CYSTOSCOPY, BILATERAL RETROGRADE PYELOGRAM, BILATERAL STENT PLACEMENT   CYSTOSCOPY WITH RETROGRADE  PYELOGRAM, URETEROSCOPY AND STENT PLACEMENT Bilateral 10/08/2021   Procedure: CYSTOSCOPY WITH LEFT RETROGRADE PYELOGRAM, URETEROSCOPY WIHT HOLMIUM LASER AND STENT PLACEMENT AND RIGHT RETROGRADE PYELOGRAM;  Surgeon: Samson Croak, MD;  Location[facility];  Service: Urology;  Laterality: Bilateral;  60 MINS FOR CASE   CYSTOSCOPY/URETEROSCOPY/HOLMIUM LASER/STENT PLACEMENT Left 11/01/2021   Procedure: CYSTOSCOPY/URETEROSCOPY/STENT PLACEMENT;  Surgeon: Samson Croak, MD;  [facility];  Service: Urology;  Laterality: Left;  1 HR  FOR CASE   EXTRACORPOREAL SHOCK WAVE LITHOTRIPSY Left 03/18/2021   Procedure: LEFT EXTRACORPOREAL SHOCK WAVE LITHOTRIPSY (ESWL);  Surgeon: Mallie Seal, MD;  Location: [facility];  Service: Urology;  Laterality: Left;   EXTRACORPOREAL SHOCK WAVE LITHOTRIPSY Left 01/21/2022   Procedure: LEFT EXTRACORPOREAL SHOCK WAVE LITHOTRIPSY (ESWL);  Surgeon: Samson Croak, MD;  Location: [facility];  Service: Urology;  Laterality: Left;   EXTRACORPOREAL SHOCK WAVE LITHOTRIPSY Left 01/28/2022   Procedure: LEFT EXTRACORPOREAL SHOCK WAVE LITHOTRIPSY (ESWL);  Surgeon: Roxane Copp, MD;  Location: [facility];  Service: Urology;  Laterality: Left;  90 MINS   TONSILLECTOMY     tummy tuck  2007   with breast lift    MEDICATIONS:  aspirin EC 81 MG tablet   atorvastatin (LIPITOR) 40 MG tablet   estradiol (ESTRACE) 0.1 MG/GM vaginal cream   halobetasol (ULTRAVATE) 0.05 % cream   hyoscyamine  (ANASPAZ ) 0.125 MG TBDP disintergrating tablet   levalbuterol  (XOPENEX  HFA) 45 MCG/ACT inhaler   losartan  (COZAAR ) 25 MG tablet   OTEZLA 30 MG TABS   tamsulosin  (FLOMAX ) 0.4 MG CAPS capsule   tamsulosin  (FLOMAX ) 0.4 MG CAPS capsule   TRELEGY ELLIPTA  100-62.5-25 MCG/ACT AEPB   venlafaxine XR (EFFEXOR-XR) 150 MG 24 hr capsule   venlafaxine XR (EFFEXOR-XR) 75 MG 24 hr capsule   No current facility-administered medications for this encounter.   [name] [facility] Short Stay/Anesthesiology [facility] Phone [phone] 05/03/2023 8:43 AM

## 2023-05-04 ENCOUNTER — Ambulatory Visit (HOSPITAL_COMMUNITY)

## 2023-05-04 ENCOUNTER — Encounter (HOSPITAL_COMMUNITY)
Admission: RE | Disposition: A | Discharge status: Home / Self Care | Payer: Self-pay | Source: Home / Self Care | Attending: Urology

## 2023-05-04 ENCOUNTER — Ambulatory Visit (HOSPITAL_COMMUNITY): Admitting: Anesthesiology

## 2023-05-04 ENCOUNTER — Ambulatory Visit (HOSPITAL_COMMUNITY)
Admission: RE | Admit: 2023-05-04 | Discharge: 2023-05-04 | Disposition: A | Discharge status: Home / Self Care | Attending: Urology | Admitting: Urology

## 2023-05-04 ENCOUNTER — Encounter (HOSPITAL_COMMUNITY): Payer: Self-pay | Admitting: Urology

## 2023-05-04 ENCOUNTER — Other Ambulatory Visit: Payer: Self-pay

## 2023-05-04 ENCOUNTER — Ambulatory Visit (HOSPITAL_COMMUNITY): Payer: Self-pay | Admitting: Medical

## 2023-05-04 DIAGNOSIS — N201 Calculus of ureter: Secondary | ICD-10-CM | POA: Diagnosis not present

## 2023-05-04 DIAGNOSIS — J449 Chronic obstructive pulmonary disease, unspecified: Secondary | ICD-10-CM | POA: Insufficient documentation

## 2023-05-04 DIAGNOSIS — I509 Heart failure, unspecified: Secondary | ICD-10-CM | POA: Insufficient documentation

## 2023-05-04 DIAGNOSIS — Z01818 Encounter for other preprocedural examination: Secondary | ICD-10-CM

## 2023-05-04 DIAGNOSIS — E119 Type 2 diabetes mellitus without complications: Secondary | ICD-10-CM | POA: Insufficient documentation

## 2023-05-04 DIAGNOSIS — E66813 Obesity, class 3: Secondary | ICD-10-CM | POA: Insufficient documentation

## 2023-05-04 DIAGNOSIS — Z6835 Body mass index (BMI) 35.0-35.9, adult: Secondary | ICD-10-CM | POA: Insufficient documentation

## 2023-05-04 DIAGNOSIS — Z9981 Dependence on supplemental oxygen: Secondary | ICD-10-CM | POA: Insufficient documentation

## 2023-05-04 DIAGNOSIS — N289 Disorder of kidney and ureter, unspecified: Secondary | ICD-10-CM | POA: Insufficient documentation

## 2023-05-04 DIAGNOSIS — Z87891 Personal history of nicotine dependence: Secondary | ICD-10-CM | POA: Insufficient documentation

## 2023-05-04 DIAGNOSIS — I110 Hypertensive heart disease with heart failure: Secondary | ICD-10-CM | POA: Insufficient documentation

## 2023-05-04 HISTORY — DX: Depression, unspecified: F32.A

## 2023-05-04 HISTORY — DX: Type 2 diabetes mellitus without complications: E11.9

## 2023-05-04 HISTORY — PX: CYSTOSCOPY/URETEROSCOPY/HOLMIUM LASER/STENT PLACEMENT: SHX6546

## 2023-05-04 LAB — GLUCOSE, CAPILLARY
Glucose-Capillary: 137 mg/dL — ABNORMAL HIGH (ref 70–99)
Glucose-Capillary: 117 mg/dL — ABNORMAL HIGH (ref 70–99)

## 2023-05-04 SURGERY — CYSTOSCOPY/URETEROSCOPY/HOLMIUM LASER/STENT PLACEMENT
Anesthesia: General | Site: Ureter | Laterality: Bilateral

## 2023-05-04 MED ORDER — CIPROFLOXACIN IN D5W 400 MG/200ML IV SOLN
400.0000 mg | INTRAVENOUS | Status: AC
  Administered 2023-05-04: 400 mg via INTRAVENOUS
  Filled 2023-05-04: qty 200

## 2023-05-04 MED ORDER — SODIUM CHLORIDE 0.9 % IR SOLN
Status: DC | PRN
  Administered 2023-05-04: 3000 mL

## 2023-05-04 MED ORDER — PHENAZOPYRIDINE HCL 200 MG PO TABS
200.0000 mg | ORAL_TABLET | Freq: Three times a day (TID) | ORAL | 0 refills | Status: DC | PRN
Start: 2023-05-04 — End: 2023-11-13

## 2023-05-04 MED ORDER — FENTANYL CITRATE (PF) 100 MCG/2ML IJ SOLN
INTRAMUSCULAR | Status: AC
  Filled 2023-05-04: qty 2

## 2023-05-04 MED ORDER — FENTANYL CITRATE (PF) 100 MCG/2ML IJ SOLN
INTRAMUSCULAR | Status: DC | PRN
  Administered 2023-05-04 (×3): 25 ug via INTRAVENOUS
  Administered 2023-05-04: 50 ug via INTRAVENOUS
  Administered 2023-05-04: 25 ug via INTRAVENOUS
  Administered 2023-05-04: 50 ug via INTRAVENOUS

## 2023-05-04 MED ORDER — IOHEXOL 300 MG/ML  SOLN
INTRAMUSCULAR | Status: DC | PRN
  Administered 2023-05-04: 30 mL

## 2023-05-04 MED ORDER — LIDOCAINE HCL (PF) 2 % IJ SOLN
INTRAMUSCULAR | Status: AC
  Filled 2023-05-04: qty 5

## 2023-05-04 MED ORDER — ONDANSETRON HCL 4 MG/2ML IJ SOLN
INTRAMUSCULAR | Status: DC | PRN
  Administered 2023-05-04: 4 mg via INTRAVENOUS

## 2023-05-04 MED ORDER — TAMSULOSIN HCL 0.4 MG PO CAPS: 0.4000 mg | ORAL_CAPSULE | Freq: Every day | ORAL | 0 refills | Status: DC

## 2023-05-04 MED ORDER — LIDOCAINE HCL (PF) 2 % IJ SOLN
INTRAMUSCULAR | Status: DC | PRN
  Administered 2023-05-04: 60 mg via INTRADERMAL

## 2023-05-04 MED ORDER — METHOCARBAMOL 750 MG PO TABS: 750.0000 mg | ORAL_TABLET | Freq: Four times a day (QID) | ORAL | 0 refills | Status: AC

## 2023-05-04 MED ORDER — PROPOFOL 10 MG/ML IV BOLUS
INTRAVENOUS | Status: DC | PRN
  Administered 2023-05-04 (×2): 15 mg via INTRAVENOUS
  Administered 2023-05-04: 100 mg via INTRAVENOUS

## 2023-05-04 MED ORDER — ONDANSETRON HCL 4 MG/2ML IJ SOLN
INTRAMUSCULAR | Status: AC
  Filled 2023-05-04: qty 2

## 2023-05-04 MED ORDER — MIDAZOLAM HCL 2 MG/2ML IJ SOLN
INTRAMUSCULAR | Status: AC
  Filled 2023-05-04: qty 2

## 2023-05-04 MED ORDER — LACTATED RINGERS IV SOLN: INTRAVENOUS | Status: DC

## 2023-05-04 MED ORDER — HYOSCYAMINE SULFATE 0.125 MG PO TBDP
0.1250 mg | ORAL_TABLET | Freq: Four times a day (QID) | ORAL | 0 refills | Status: DC | PRN
Start: 2023-05-04 — End: 2023-11-13

## 2023-05-04 MED ORDER — DEXAMETHASONE SODIUM PHOSPHATE 10 MG/ML IJ SOLN
INTRAMUSCULAR | Status: DC | PRN
  Administered 2023-05-04: 4 mg via INTRAVENOUS

## 2023-05-04 MED ORDER — ORAL CARE MOUTH RINSE: 15.0000 mL | Freq: Once | OROMUCOSAL | Status: AC

## 2023-05-04 MED ORDER — INSULIN ASPART 100 UNIT/ML IJ SOLN: 0.0000 [IU] | INTRAMUSCULAR | Status: DC | PRN

## 2023-05-04 MED ORDER — PROPOFOL 10 MG/ML IV BOLUS
INTRAVENOUS | Status: AC
  Filled 2023-05-04: qty 20

## 2023-05-04 MED ORDER — FENTANYL CITRATE PF 50 MCG/ML IJ SOSY: 25.0000 ug | PREFILLED_SYRINGE | INTRAMUSCULAR | Status: DC | PRN

## 2023-05-04 MED ORDER — MIDAZOLAM HCL 2 MG/2ML IJ SOLN
INTRAMUSCULAR | Status: DC | PRN
  Administered 2023-05-04: 2 mg via INTRAVENOUS

## 2023-05-04 MED ORDER — CHLORHEXIDINE GLUCONATE 0.12 % MT SOLN
15.0000 mL | Freq: Once | OROMUCOSAL | Status: AC
  Administered 2023-05-04: 15 mL via OROMUCOSAL

## 2023-05-04 SURGICAL SUPPLY — 20 items
BAG URO CATCHER STRL LF (MISCELLANEOUS) ×1 IMPLANT
BASKET ZERO TIP NITINOL 2.4FR (BASKET) IMPLANT
CATH URETL OPEN 5X70 (CATHETERS) ×1 IMPLANT
CLOTH BEACON ORANGE TIMEOUT ST (SAFETY) ×1 IMPLANT
EXTRACTOR STONE 1.7FRX115CM (UROLOGICAL SUPPLIES) IMPLANT
FIBER LASER MOSES 200 DFL (Laser) IMPLANT
FIBER LASER MOSES 365 DFL (Laser) IMPLANT
GLOVE BIO SURGEON STRL SZ8 (GLOVE) ×1 IMPLANT
GOWN STRL REUS W/ TWL XL LVL3 (GOWN DISPOSABLE) ×1 IMPLANT
GUIDEWIRE ANG ZIPWIRE 038X150 (WIRE) IMPLANT
GUIDEWIRE STR DUAL SENSOR (WIRE) ×1 IMPLANT
KIT TURNOVER KIT A (KITS) IMPLANT
MANIFOLD NEPTUNE II (INSTRUMENTS) ×1 IMPLANT
PACK CYSTO (CUSTOM PROCEDURE TRAY) ×1 IMPLANT
SHEATH NAVIGATOR HD 11/13X36 (SHEATH) ×1 IMPLANT
SHEATH NAVIGATOR HD 12/14X28 (SHEATH) IMPLANT
SHEATH NAVIGATOR HD 12/14X36 (SHEATH) IMPLANT
STENT URET 6FRX24 CONTOUR (STENTS) IMPLANT
TUBING CONNECTING 10 (TUBING) ×1 IMPLANT
TUBING UROLOGY SET (TUBING) ×1 IMPLANT

## 2023-05-04 NOTE — Discharge Instructions (Signed)
 DISCHARGE INSTRUCTIONS FOR Ureteroscopy and/or Ureteral Stent Placement  MEDICATIONS:  1.  Robaxin  2. Tamsulosin   3. Hyoscyamine   4. Methocarbamol   ACTIVITY:  1. No strenuous activity x 1week  2. No driving while on narcotic pain medications  3. Drink plenty of water  4. Continue to walk at home - it is normal to see blood in the urine while the stent is in place, so keep active, but don't over do it.  5. May return to work/school tomorrow or when you feel ready  6. You may experience some pain when urinating in the kidney on the side that was operated on while the stent is in place this is normal  WHAT IS NORMAL TO EXPERIENCE: It is normal to feel the urge to urinate while the stent is in place It is normal to have blood in your urine while the stent is in place  It sometimes can be normal to have pain in your kidney when you urinate   BATHING:  1. You can shower and we recommend daily showers  2. You have a string coming from your urethra: The stent string is attached to your ureteral stent. Do not pull on this until instructed.   DIET: You may return to your normal diet immediately. Because of the raw surface of your bladder, alcohol, spicy foods, acid type foods and drinks with caffeine may cause irritation or frequency and should be used in moderation. To keep your urine flowing freely and to avoid constipation, drink plenty of fluids during the day ( 8-10 glasses ). Tip: Avoid cranberry juice because it is very acidic.  SIGNS/SYMPTOMS TO CALL:  Please call us  if you have a fever greater than 101.5, uncontrolled nausea/vomiting, uncontrolled pain, dizziness, unable to urinate, bloody urine with clots greater than the size of a quarter, chest pain, shortness of breath, leg swelling, leg pain, redness around wound, drainage from wound, or any other concerns or questions.   You can reach us  at [phone].   FOLLOW-UP:  1. You may remove your stent in on 05/08/23. To do this go  into the shower, grab hold of the tether coming from your urethra. Pull the tether consistent motion until the stent is removed from your body. The stent will be around 10 inches long with a curl on either end.  2. You you have been set up for f/u in 6-8 weeks

## 2023-05-04 NOTE — Op Note (Signed)
 Preoperative diagnosis: bilateral ureteral calculus  Postoperative diagnosis:  ureteral calculus  Procedure:  Cystoscopy bilateral ureteroscopy and stone removal Ureteroscopic laser lithotripsy bilateral 34F x 24 ureteral stent placement with strings bilateral retrograde pyelography with interpretation  Surgeon: Dr. Aimee Alf  Anesthesia: General  Complications: None  Intraoperative findings:  Left-sided ureter with matrix like stone basketed out. Left flexible ureteroscopy and pan pyeloscopy demonstrates no residual stone in the renal pelvis. Right-sided stone slightly impacted.  Stone fragmented using laser and removed. Multiple matrix like stones in the right ureter as well. Bilateral retrograde pyelograms demonstrate no filling defects no ureteral narrowing no extravasation.  Bilateral retrograde pyelogram interpretation: Right retrograde pyelogram: No filling defects, no ureteral narrowing no extravasation Left retrograde pyelogram: No filling defects no urinary injury ureteral narrowing no extravasation.  EBL: Minimal  Specimens: None  Disposition of specimens: [facility] for stone analysis  Indication: Mckenzie Sullivan is a 63 y.o.   patient with bilateral ureteral stone and associated bilateral symptoms she presented to the clinic a week ago and had stents placed. After reviewing the management options for treatment, the patient elected to proceed with the above surgical procedure(s). We have discussed the potential benefits and risks of the procedure, side effects of the proposed treatment, the likelihood of the patient achieving the goals of the procedure, and any potential problems that might occur during the procedure or recuperation. Informed consent has been obtained.   Description of procedure:  The patient was taken to the operating room and general anesthesia was induced.  The patient was placed in the dorsal lithotomy position, prepped  and draped in the usual sterile fashion, and preoperative antibiotics were administered. A preoperative time-out was performed.   Cystourethroscopy was performed.  The patient's urethra was examined and was normal. . Pan cystoscopy was performed and the bladder systematically examined in its entirety. There was no evidence for any bladder tumors, stones, or other mucosal pathology.  Bilateral stents were in appropriate position.  Attention then turned to the left ureteral orifice.  A 0.38 sensor guidewire was then advanced up the left ureter into the renal pelvis under fluoroscopic guidance.  The stent was then removed the 6 Fr semirigid ureteroscope was then advanced into the ureter next to the guidewire and the calculus was identified.  Stone was then encountered and found to be very matrix like.  A basket was then used to pull out the matrix like stone and leave in the bladder.  Once you are was cleared because there is no hard stone found in the matrix like stone removed decision is made to do a flexible ureteroscopy.  A sensor wire was placed through the semirigid ureteroscope and the semirigid ureteroscope was removed.  Then a dual-lumen flexible ureteroscope was advanced into the left ureter pan pyeloscopy was performed no residual stone was found.  The scope was then removed prior to removing the scope and the entire ureter the left retrograde pyelogram was performed demonstrating findings as noted above.  Upon review of the ureter with the ureteroscope there were no residual stones within the ureter.  The wire was left in place.  Attention was then turned to the right ureter where a sensor wire was placed adjacent to the stent the stent was then removed without complication per placement the wire was confirmed with fluoroscopy.  Then a 6 French semirigid ureteroscope was advanced into the right ureter stone was identified in the proximal ureter there was moderately impacted.  This was broken  up using a 200 m laser on 0.5 J and 5 Hz.  Once the stone was broken up all the fragments were basketed out.  The scope was pushed past the area where the stone was and some residual stones were noted at the UPJ these were basketed out removed without complication there was additional matrix like stone.  Which was removed.  Once years Curaler retrograde pyelogram was performed demonstrating no filling defects no extravasation no ureteral narrowing.  The semirigid ureteroscope was removed inspect the ureter no residual stones were noted.  Wire was left in place.  On bilateral sides the 6 x 24 double-J stent was then loaded onto a wire and then placed in the ureter bilaterally under fluoroscopic guidance leaving the strings on.  The bladder was then emptied and the procedure ended.  The patient appeared to tolerate the procedure well and without complications.  The patient was able to be awakened and transferred to the recovery unit in satisfactory condition.   Disposition: The tether of the stent was left on strings.  It was then taped to the mons pubis..  Instructions for removing the stent have been provided to the patient. The patient has been scheduled for followup in 6 weeks with a renal ultrasound.

## 2023-05-04 NOTE — Anesthesia Postprocedure Evaluation (Signed)
 Anesthesia Post Note  Patient: Mckenzie Sullivan  Procedure(s) Performed: CYSTOSCOPY/URETEROSCOPY/HOLMIUM LASER/STENT PLACEMENT (Bilateral: Ureter)     Patient location during evaluation: PACU Anesthesia Type: General Level of consciousness: awake and alert Pain management: pain level controlled Vital Signs Assessment: post-procedure vital signs reviewed and stable Respiratory status: spontaneous breathing, nonlabored ventilation, respiratory function stable and patient connected to nasal cannula oxygen Cardiovascular status: blood pressure returned to baseline and stable Postop Assessment: no apparent nausea or vomiting Anesthetic complications: no  No notable events documented.  Last Vitals:  Vitals:   05/04/23 1345 05/04/23 1405  BP: (!) 138/59 (!) 154/56  Pulse: 80 92  Resp: 20 15  Temp:  (!) 36.4 C  SpO2: 99% 97%    Last Pain:  Vitals:   05/04/23 1405  TempSrc:   PainSc: 0-No pain                 Jacky Hartung,W. EDMOND

## 2023-05-04 NOTE — Anesthesia Procedure Notes (Addendum)
 Procedure Name: LMA Insertion Date/Time: 05/04/2023 11:28 AM  Performed by: Mervyn Ace, CRNAPre-anesthesia Checklist: Patient identified, Emergency Drugs available, Suction available, Patient being monitored and Timeout performed Patient Re-evaluated:Patient Re-evaluated prior to induction Oxygen Delivery Method: Circle system utilized Preoxygenation: Pre-oxygenation with 100% oxygen Induction Type: IV induction Ventilation: Mask ventilation without difficulty LMA: LMA with gastric port inserted LMA Size: 4.0 Number of attempts: 1 Placement Confirmation: positive ETCO2 and CO2 detector Tube secured with: secured with pink Hy-tape. Dental Injury: Teeth and Oropharynx as per pre-operative assessment

## 2023-05-04 NOTE — Transfer of Care (Signed)
 Immediate Anesthesia Transfer of Care Note  Patient: Mckenzie Sullivan  Procedure(s) Performed: CYSTOSCOPY/URETEROSCOPY/HOLMIUM LASER/STENT PLACEMENT (Bilateral: Ureter)  Patient Location: PACU  Anesthesia Type:General  Level of Consciousness: awake, alert , oriented, and patient cooperative  Airway & Oxygen Therapy: Patient Spontanous Breathing and Patient connected to face mask oxygen  Post-op Assessment: Report given to RN and Post -op Vital signs reviewed and stable  Post vital signs: Reviewed and stable  Last Vitals:  Vitals Value Taken Time  BP 127/70 05/04/23 1247  Temp 36.7 C 05/04/23 1247  Pulse 88 05/04/23 1250  Resp 17 05/04/23 1250  SpO2 94 % 05/04/23 1250  Vitals shown include unfiled device data.  Last Pain:  Vitals:   05/04/23 1247  TempSrc:   PainSc: 0-No pain         Complications: No notable events documented.

## 2023-05-05 ENCOUNTER — Encounter (HOSPITAL_COMMUNITY): Payer: Self-pay | Admitting: Urology

## 2023-06-12 ENCOUNTER — Other Ambulatory Visit: Payer: Self-pay

## 2023-06-12 ENCOUNTER — Emergency Department (HOSPITAL_COMMUNITY)

## 2023-06-12 ENCOUNTER — Encounter (HOSPITAL_COMMUNITY): Payer: Self-pay

## 2023-06-12 ENCOUNTER — Emergency Department (HOSPITAL_COMMUNITY)
Admission: EM | Admit: 2023-06-12 | Discharge: 2023-06-12 | Disposition: A | Discharge status: Home / Self Care | Attending: Emergency Medicine | Admitting: Emergency Medicine

## 2023-06-12 DIAGNOSIS — N23 Unspecified renal colic: Secondary | ICD-10-CM

## 2023-06-12 DIAGNOSIS — Z7951 Long term (current) use of inhaled steroids: Secondary | ICD-10-CM | POA: Insufficient documentation

## 2023-06-12 DIAGNOSIS — R109 Unspecified abdominal pain: Secondary | ICD-10-CM | POA: Diagnosis present

## 2023-06-12 DIAGNOSIS — N132 Hydronephrosis with renal and ureteral calculous obstruction: Secondary | ICD-10-CM | POA: Insufficient documentation

## 2023-06-12 DIAGNOSIS — Z7982 Long term (current) use of aspirin: Secondary | ICD-10-CM | POA: Insufficient documentation

## 2023-06-12 DIAGNOSIS — J449 Chronic obstructive pulmonary disease, unspecified: Secondary | ICD-10-CM | POA: Insufficient documentation

## 2023-06-12 DIAGNOSIS — Z79899 Other long term (current) drug therapy: Secondary | ICD-10-CM | POA: Insufficient documentation

## 2023-06-12 LAB — COMPREHENSIVE METABOLIC PANEL WITH GFR
ALT: 19 U/L (ref 0–44)
AST: 22 U/L (ref 15–41)
Albumin: 4 g/dL (ref 3.5–5.0)
Alkaline Phosphatase: 100 U/L (ref 38–126)
Anion gap: 12 (ref 5–15)
BUN: 16 mg/dL (ref 8–23)
CO2: 24 mmol/L (ref 22–32)
Calcium: 9.7 mg/dL (ref 8.9–10.3)
Chloride: 103 mmol/L (ref 98–111)
Creatinine, Ser: 0.89 mg/dL (ref 0.44–1.00)
GFR, Estimated: >60 mL/min
Glucose, Bld: 128 mg/dL — ABNORMAL HIGH (ref 70–99)
Potassium: 4 mmol/L (ref 3.5–5.1)
Sodium: 139 mmol/L (ref 135–145)
Total Bilirubin: 0.6 mg/dL (ref 0.0–1.2)
Total Protein: 7.8 g/dL (ref 6.5–8.1)

## 2023-06-12 LAB — URINALYSIS, ROUTINE W REFLEX MICROSCOPIC
Bilirubin Urine: NEGATIVE
Glucose, UA: NEGATIVE mg/dL
Ketones, ur: NEGATIVE mg/dL
Nitrite: NEGATIVE
Protein, ur: NEGATIVE mg/dL
Specific Gravity, Urine: 1.014 (ref 1.005–1.030)
pH: 5 (ref 5.0–8.0)

## 2023-06-12 LAB — CBC
HCT: 37.6 % (ref 36.0–46.0)
Hemoglobin: 11.6 g/dL — ABNORMAL LOW (ref 12.0–15.0)
MCH: 26.7 pg (ref 26.0–34.0)
MCHC: 30.9 g/dL (ref 30.0–36.0)
MCV: 86.6 fL (ref 80.0–100.0)
Platelets: 339 10*3/uL (ref 150–400)
RBC: 4.34 MIL/uL (ref 3.87–5.11)
RDW: 14 % (ref 11.5–15.5)
WBC: 11.2 10*3/uL — ABNORMAL HIGH (ref 4.0–10.5)
nRBC: 0 % (ref 0.0–0.2)

## 2023-06-12 MED ORDER — FENTANYL CITRATE PF 50 MCG/ML IJ SOSY
50.0000 ug | PREFILLED_SYRINGE | Freq: Once | INTRAMUSCULAR | Status: AC
  Administered 2023-06-12: 50 ug via INTRAVENOUS
  Filled 2023-06-12: qty 1

## 2023-06-12 MED ORDER — KETOROLAC TROMETHAMINE 15 MG/ML IJ SOLN
15.0000 mg | Freq: Once | INTRAMUSCULAR | Status: AC
  Administered 2023-06-12: 15 mg via INTRAVENOUS
  Filled 2023-06-12: qty 1

## 2023-06-12 MED ORDER — ONDANSETRON HCL 4 MG/2ML IJ SOLN
4.0000 mg | Freq: Once | INTRAMUSCULAR | Status: AC
Start: 2023-06-12 — End: 2023-06-12
  Administered 2023-06-12: 4 mg via INTRAVENOUS
  Filled 2023-06-12: qty 2

## 2023-06-12 MED ORDER — HYDROCODONE-ACETAMINOPHEN 5-325 MG PO TABS: 1.0000 | ORAL_TABLET | Freq: Four times a day (QID) | ORAL | 0 refills | Status: DC | PRN

## 2023-06-12 NOTE — ED Provider Notes (Signed)
 [facility] AT [facility] Provider Note   CSN: [id number] Arrival date & time: 06/12/23  2952     History  Chief Complaint  Patient presents with   Flank Pain    Mckenzie Sullivan is a 63 y.o. female.  The history is provided by the patient and medical records.  Flank Pain  Mckenzie Sullivan is a 63 y.o. female who presents to the Emergency Department complaining of flank pain.  She presents to the emergency department for evaluation of right flank pain.  She reports 1 week of intermittent right-sided back pain, significantly worsening over the last several hours.  She initially thought that her pain was secondary to a pulled muscle but now thinks it might be due to a kidney stone.  No associated fever but she does have nausea.  She reports decreased urinary output with last urination around 5 PM.  She does not have the urge to urinate.  No dysuria.  She does have a history of bilateral obstructing stones in April requiring stenting.  No difficulty breathing, abdominal pain.  Symptoms are moderate and constant in nature.  She does have a history of COPD and is on home oxygen at baseline.     Home Medications Prior to Admission medications   Medication Sig Start Date End Date Taking? Authorizing Provider  HYDROcodone -acetaminophen  (NORCO/VICODIN) 5-325 MG tablet Take 1 tablet by mouth every 6 (six) hours as needed. 06/12/23  Yes Kelsey Patricia, MD  aspirin EC 81 MG tablet Take 81 mg by mouth daily. Swallow whole.    [provider]  atorvastatin (LIPITOR) 40 MG tablet Take 40 mg by mouth daily. 04/17/22   [provider]  estradiol (ESTRACE) 0.1 MG/GM vaginal cream Place vaginally. Patient not taking: Reported on 04/23/2023 11/18/21   [provider]  halobetasol (ULTRAVATE) 0.05 % cream Apply topically. Patient not taking: Reported on 04/23/2023 11/29/21   [provider]  hyoscyamine  (ANASPAZ ) 0.125 MG TBDP disintergrating  tablet Place 1 tablet (0.125 mg total) under the tongue every 6 (six) hours as needed for up to 20 doses. 04/24/23   Thelbert Finner, MD  hyoscyamine  (ANASPAZ ) 0.125 MG TBDP disintergrating tablet Place 1 tablet (0.125 mg total) under the tongue every 6 (six) hours as needed for up to 20 doses. 05/04/23   Thelbert Finner, MD  levalbuterol  (XOPENEX  HFA) 45 MCG/ACT inhaler Inhale into the lungs every 4 (four) hours as needed for wheezing.    [provider]  losartan  (COZAAR ) 25 MG tablet TAKE ONE TABLET BY MOUTH EVERY MORNING Patient taking differently: Take 25 mg by mouth daily as needed (Per Cardiologist). 06/20/22   Tolia, Sunit, DO  OTEZLA 30 MG TABS Take 1 tablet by mouth 2 (two) times daily. 02/13/22   [provider]  phenazopyridine  (PYRIDIUM ) 200 MG tablet Take 1 tablet (200 mg total) by mouth 3 (three) times daily as needed for up to 6 doses. 05/04/23   Thelbert Finner, MD  tamsulosin  (FLOMAX ) 0.4 MG CAPS capsule Take 0.4 mg by mouth daily. 01/31/22   [provider]  tamsulosin  (FLOMAX ) 0.4 MG CAPS capsule Take 1 capsule (0.4 mg total) by mouth daily after supper. 04/24/23   Thelbert Finner, MD  tamsulosin  (FLOMAX ) 0.4 MG CAPS capsule Take 1 capsule (0.4 mg total) by mouth daily after supper. 05/04/23   Thelbert Finner, MD  TRELEGY ELLIPTA  100-62.5-25 MCG/ACT AEPB INHALE ONE PUFF BY MOUTH INTO THE LUNGS ONE TIME DAILY 02/17/23  Hunsucker, Archer Kobs, MD  venlafaxine XR (EFFEXOR-XR) 150 MG 24 hr capsule Take 150 mg by mouth daily. Patient takes 150mg  + 75mg  to make 225mg  04/20/23   [provider]  venlafaxine XR (EFFEXOR-XR) 75 MG 24 hr capsule Take 75 mg by mouth at bedtime. Patient takes 75mg  + 150mg  to make 225mg     [provider]      Allergies    Penicillins and Cephalexin    Review of Systems   Review of Systems  Genitourinary:  Positive for flank pain.  All other systems reviewed and are negative.   Physical Exam Updated  Vital Signs BP (!) 146/67 (BP Location: Left Arm)   Pulse 65   Temp 97.9 F (36.6 C) (Oral)   Resp 20   Ht 5\' 3"  (1.6 m)   Wt 90.7 kg   SpO2 100%   BMI 35.43 kg/m  Physical Exam Vitals and nursing note reviewed.  Constitutional:      Appearance: She is well-developed.  HENT:     Head: Normocephalic and atraumatic.  Cardiovascular:     Rate and Rhythm: Normal rate and regular rhythm.     Heart sounds: No murmur heard. Pulmonary:     Effort: Pulmonary effort is normal. No respiratory distress.     Breath sounds: Normal breath sounds.  Abdominal:     Palpations: Abdomen is soft.     Tenderness: There is no abdominal tenderness. There is right CVA tenderness. There is no guarding or rebound.  Musculoskeletal:        General: No tenderness.  Skin:    General: Skin is warm and dry.  Neurological:     Mental Status: She is alert and oriented to person, place, and time.  Psychiatric:        Behavior: Behavior normal.     ED Results / Procedures / Treatments   Labs (all labs ordered are listed, but only abnormal results are displayed) Labs Reviewed  URINALYSIS, ROUTINE W REFLEX MICROSCOPIC - Abnormal; Notable for the following components:      Result Value   Hgb urine dipstick SMALL (*)    Leukocytes,Ua SMALL (*)    Bacteria, UA RARE (*)    All other components within normal limits  CBC - Abnormal; Notable for the following components:   WBC 11.2 (*)    Hemoglobin 11.6 (*)    All other components within normal limits  COMPREHENSIVE METABOLIC PANEL WITH GFR - Abnormal; Notable for the following components:   Glucose, Bld 128 (*)    All other components within normal limits  URINE CULTURE    EKG None  Radiology CT Renal Stone Study Result Date: 06/12/2023 CLINICAL DATA:  Abdominal flank pain. Kidney stones suspected. Right-sided flank pain. EXAM: CT ABDOMEN AND PELVIS WITHOUT CONTRAST TECHNIQUE: Multidetector CT imaging of the abdomen and pelvis was performed  following the standard protocol without IV contrast. RADIATION DOSE REDUCTION: This exam was performed according to the departmental dose-optimization program which includes automated exposure control, adjustment of the mA and/or kV according to patient size and/or use of iterative reconstruction technique. COMPARISON:  04/23/2023 FINDINGS: Lower chest: No acute findings. Hepatobiliary: The liver shows diffusely decreased attenuation suggesting fat deposition. No suspicious focal abnormality in the liver on this study without intravenous contrast. There is no evidence for gallstones, gallbladder wall thickening, or pericholecystic fluid. No intrahepatic or extrahepatic biliary dilation. Pancreas: No focal mass lesion. No dilatation of the main duct. No intraparenchymal cyst. No peripancreatic edema. Spleen:  No splenomegaly. No suspicious focal mass lesion. Adrenals/Urinary Tract: No adrenal nodule or mass. Moderate right hydronephrosis with punctate nonobstructing stone seen in the upper pole right kidney (22/2) and a 2 mm nonobstructing lower pole right renal stone on 35/2. 4 x 4 x 4 mm stone is identified at or just distal to the right UPJ (axial 40/2 and coronal 81/6). Remaining right ureter unremarkable. Punctate nonobstructing stone seen upper pole left kidney with 2 mm nonobstructing left interpolar stone. Mild fullness noted left intrarenal collecting system, decreased since prior and without left-sided hydroureter. There is no left ureteral stone. Phlebolith along the left pelvic sidewall (axial 70/2) projects outside the ureter and is unchanged since prior study. Bladder is decompressed without evidence for intraluminal stone disease. Stomach/Bowel: Stomach is unremarkable. No gastric wall thickening. No evidence of outlet obstruction. Duodenum is normally positioned as is the ligament of Treitz. No small bowel wall thickening. No small bowel dilatation. The terminal ileum is normal. The appendix is normal.  No gross colonic mass. No colonic wall thickening. Diverticular changes are noted in the left colon without evidence of diverticulitis. Vascular/Lymphatic: There is moderate atherosclerotic calcification of the abdominal aorta without aneurysm. There is no gastrohepatic or hepatoduodenal ligament lymphadenopathy. No retroperitoneal or mesenteric lymphadenopathy. No pelvic sidewall lymphadenopathy. Reproductive: Unremarkable. Other: No intraperitoneal free fluid. Musculoskeletal: No worrisome lytic or sclerotic osseous abnormality. IMPRESSION: 1. 4 x 4 x 4 mm stone at or just distal to the right UPJ with moderate right hydronephrosis. 2. Additional bilateral nonobstructing renal stones. 3. Hepatic steatosis. 4. Left colonic diverticulosis without diverticulitis. 5.  Aortic Atherosclerosis (ICD10-I70.0). Electronically Signed   By: Donnal Fusi M.D.   On: 06/12/2023 06:19    Procedures Procedures    Medications Ordered in ED Medications  fentaNYL  (SUBLIMAZE ) injection 50 mcg (50 mcg Intravenous Given 06/12/23 0546)  ondansetron  (ZOFRAN ) injection 4 mg (4 mg Intravenous Given 06/12/23 0546)  ketorolac  (TORADOL ) 15 MG/ML injection 15 mg (15 mg Intravenous Given 06/12/23 1610)    ED Course/ Medical Decision Making/ A&P                                 Medical Decision Making Amount and/or Complexity of Data Reviewed Labs: ordered. Radiology: ordered.  Risk Prescription drug management.   Patient with history of COPD on home oxygen, recurrent ureteral stones status post stenting as well as recent history of bilateral ureteral stones here for evaluation of decreased urine output and right flank pain.  She is nontoxic-appearing on evaluation.  Labs with stable renal function.  CT does demonstrate an obstructing right ureteral stone.  There is no evidence of bilateral obstruction.  Bladder is decompressed and she was able to urinate in the emergency department.  UA is not consistent with UTI, patient  without active dysuria.  Given her stone will send urine culture.  Pain is controlled in the emergency department after medications.  Discussed with patient home care for kidney stone.  She is scheduled to follow-up with urology later this week.  Discussed outpatient follow-up as well as return precautions.  Will prescribe as needed pain medication.        Final Clinical Impression(s) / ED Diagnoses Final diagnoses:  Renal colic on right side    Rx / DC Orders ED Discharge Orders          Ordered    HYDROcodone -acetaminophen  (NORCO/VICODIN) 5-325 MG tablet  Every 6 hours PRN  06/12/23 1610              Kelsey Patricia, MD 06/12/23 [phone]

## 2023-06-12 NOTE — ED Triage Notes (Addendum)
 Patient reports urinary issues. Has an extensive urinary history. Patient  has flank pain on the right side. Patient denies urinary frequency or urge. States the last time she voided was yesterday. Before bed she tried to void and it was a dribble. Last night she got up twice but could not void. States this has happened before and it was related to a blockage in her kidneys or bladder. Reports back pain for about a week.  Patient wears 3L of oxygen at baseline.

## 2023-06-13 LAB — URINE CULTURE

## 2023-06-27 ENCOUNTER — Encounter (HOSPITAL_COMMUNITY): Payer: Self-pay | Admitting: *Deleted

## 2023-06-27 ENCOUNTER — Other Ambulatory Visit: Payer: Self-pay

## 2023-06-27 ENCOUNTER — Emergency Department (HOSPITAL_COMMUNITY)

## 2023-06-27 ENCOUNTER — Emergency Department (HOSPITAL_COMMUNITY)
Admission: EM | Admit: 2023-06-27 | Discharge: 2023-06-27 | Disposition: A | Discharge status: Home / Self Care | Attending: Emergency Medicine | Admitting: Emergency Medicine

## 2023-06-27 DIAGNOSIS — Z7982 Long term (current) use of aspirin: Secondary | ICD-10-CM | POA: Insufficient documentation

## 2023-06-27 DIAGNOSIS — D72829 Elevated white blood cell count, unspecified: Secondary | ICD-10-CM | POA: Diagnosis not present

## 2023-06-27 DIAGNOSIS — R109 Unspecified abdominal pain: Secondary | ICD-10-CM | POA: Diagnosis present

## 2023-06-27 LAB — URINALYSIS, ROUTINE W REFLEX MICROSCOPIC
Bacteria, UA: NONE SEEN
Bilirubin Urine: NEGATIVE
Glucose, UA: NEGATIVE mg/dL
Hgb urine dipstick: NEGATIVE
Ketones, ur: NEGATIVE mg/dL
Nitrite: NEGATIVE
Protein, ur: NEGATIVE mg/dL
Specific Gravity, Urine: 1.017 (ref 1.005–1.030)
pH: 6 (ref 5.0–8.0)

## 2023-06-27 LAB — CBC WITH DIFFERENTIAL/PLATELET
Abs Immature Granulocytes: 0.07 10*3/uL (ref 0.00–0.07)
Basophils Absolute: 0.1 10*3/uL (ref 0.0–0.1)
Basophils Relative: 1 %
Eosinophils Absolute: 0.2 10*3/uL (ref 0.0–0.5)
Eosinophils Relative: 2 %
HCT: 39.1 % (ref 36.0–46.0)
Hemoglobin: 11.9 g/dL — ABNORMAL LOW (ref 12.0–15.0)
Immature Granulocytes: 1 %
Lymphocytes Relative: 10 %
Lymphs Abs: 1.2 10*3/uL (ref 0.7–4.0)
MCH: 26.4 pg (ref 26.0–34.0)
MCHC: 30.4 g/dL (ref 30.0–36.0)
MCV: 86.9 fL (ref 80.0–100.0)
Monocytes Absolute: 0.6 10*3/uL (ref 0.1–1.0)
Monocytes Relative: 5 %
Neutro Abs: 10.2 10*3/uL — ABNORMAL HIGH (ref 1.7–7.7)
Neutrophils Relative %: 81 %
Platelets: 321 10*3/uL (ref 150–400)
RBC: 4.5 MIL/uL (ref 3.87–5.11)
RDW: 14 % (ref 11.5–15.5)
WBC: 12.5 10*3/uL — ABNORMAL HIGH (ref 4.0–10.5)
nRBC: 0 % (ref 0.0–0.2)

## 2023-06-27 LAB — BASIC METABOLIC PANEL WITH GFR
Anion gap: 13 (ref 5–15)
BUN: 15 mg/dL (ref 8–23)
CO2: 24 mmol/L (ref 22–32)
Calcium: 9.2 mg/dL (ref 8.9–10.3)
Chloride: 102 mmol/L (ref 98–111)
Creatinine, Ser: 1.05 mg/dL — ABNORMAL HIGH (ref 0.44–1.00)
GFR, Estimated: >60 mL/min (ref >60)
Glucose, Bld: 154 mg/dL — ABNORMAL HIGH (ref 70–99)
Potassium: 3.6 mmol/L (ref 3.5–5.1)
Sodium: 139 mmol/L (ref 135–145)

## 2023-06-27 MED ORDER — KETOROLAC TROMETHAMINE 10 MG PO TABS: 10.0000 mg | ORAL_TABLET | Freq: Four times a day (QID) | ORAL | 0 refills | Status: DC | PRN

## 2023-06-27 MED ORDER — SODIUM CHLORIDE 0.9 % IV BOLUS
1000.0000 mL | Freq: Once | INTRAVENOUS | Status: AC
  Administered 2023-06-27: 1000 mL via INTRAVENOUS

## 2023-06-27 MED ORDER — HYDROMORPHONE HCL 1 MG/ML IJ SOLN
1.0000 mg | Freq: Once | INTRAMUSCULAR | Status: AC
  Administered 2023-06-27: 1 mg via INTRAVENOUS
  Filled 2023-06-27: qty 1

## 2023-06-27 MED ORDER — KETOROLAC TROMETHAMINE 30 MG/ML IJ SOLN
15.0000 mg | Freq: Once | INTRAMUSCULAR | Status: AC
  Administered 2023-06-27: 15 mg via INTRAVENOUS
  Filled 2023-06-27: qty 1

## 2023-06-27 MED ORDER — ONDANSETRON HCL 4 MG PO TABS: 4.0000 mg | ORAL_TABLET | Freq: Four times a day (QID) | ORAL | 0 refills | Status: DC

## 2023-06-27 MED ORDER — ONDANSETRON HCL 4 MG/2ML IJ SOLN
4.0000 mg | Freq: Once | INTRAMUSCULAR | Status: AC
  Administered 2023-06-27: 4 mg via INTRAVENOUS
  Filled 2023-06-27: qty 2

## 2023-06-27 MED ORDER — OXYCODONE HCL 5 MG PO TABS: 5.0000 mg | ORAL_TABLET | ORAL | 0 refills | Status: DC | PRN

## 2023-06-27 NOTE — ED Provider Notes (Signed)
 [place] EMERGENCY DEPARTMENT AT [facility] Provider Note   CSN: [id number] Arrival date & time: 06/27/23  9250     Patient presents with: Flank Pain   Mckenzie Sullivan is a 63 y.o. female.   This is a 63 year old female here today with left-sided flank pain.  She has known stones, has required stents and lithotripsy previously.  No fever.   Flank Pain       Prior to Admission medications   Medication Sig Start Date End Date Taking? Authorizing Provider  ketorolac  (TORADOL ) 10 MG tablet Take 1 tablet (10 mg total) by mouth every 6 (six) hours as needed. 06/27/23  Yes Mannie Pac T, DO  ondansetron  (ZOFRAN ) 4 MG tablet Take 1 tablet (4 mg total) by mouth every 6 (six) hours. 06/27/23  Yes Mannie Pac T, DO  oxyCODONE  (ROXICODONE ) 5 MG immediate release tablet Take 1 tablet (5 mg total) by mouth every 4 (four) hours as needed for severe pain (pain score 7-10). 06/27/23  Yes Mannie Pac T, DO  aspirin EC 81 MG tablet Take 81 mg by mouth daily. Swallow whole.    [provider]  atorvastatin (LIPITOR) 40 MG tablet Take 40 mg by mouth daily. 04/17/22   [provider]  estradiol (ESTRACE) 0.1 MG/GM vaginal cream Place vaginally. Patient not taking: Reported on 04/23/2023 11/18/21   [provider]  halobetasol (ULTRAVATE) 0.05 % cream Apply topically. Patient not taking: Reported on 04/23/2023 11/29/21   [provider]  HYDROcodone -acetaminophen  (NORCO/VICODIN) 5-325 MG tablet Take 1 tablet by mouth every 6 (six) hours as needed. 06/12/23   Griselda Norris, MD  hyoscyamine  (ANASPAZ ) 0.125 MG TBDP disintergrating tablet Place 1 tablet (0.125 mg total) under the tongue every 6 (six) hours as needed for up to 20 doses. 04/24/23   Shane Steffan BROCKS, MD  hyoscyamine  (ANASPAZ ) 0.125 MG TBDP disintergrating tablet Place 1 tablet (0.125 mg total) under the tongue every 6 (six) hours as needed for up to 20 doses. 05/04/23   Shane Steffan BROCKS,  MD  levalbuterol  (XOPENEX  HFA) 45 MCG/ACT inhaler Inhale into the lungs every 4 (four) hours as needed for wheezing.    [provider]  losartan  (COZAAR ) 25 MG tablet TAKE ONE TABLET BY MOUTH EVERY MORNING Patient taking differently: Take 25 mg by mouth daily as needed (Per Cardiologist). 06/20/22   Tolia, Sunit, DO  OTEZLA 30 MG TABS Take 1 tablet by mouth 2 (two) times daily. 02/13/22   [provider]  phenazopyridine  (PYRIDIUM ) 200 MG tablet Take 1 tablet (200 mg total) by mouth 3 (three) times daily as needed for up to 6 doses. 05/04/23   Shane Steffan BROCKS, MD  tamsulosin  (FLOMAX ) 0.4 MG CAPS capsule Take 0.4 mg by mouth daily. 01/31/22   [provider]  tamsulosin  (FLOMAX ) 0.4 MG CAPS capsule Take 1 capsule (0.4 mg total) by mouth daily after supper. 04/24/23   Shane Steffan BROCKS, MD  tamsulosin  (FLOMAX ) 0.4 MG CAPS capsule Take 1 capsule (0.4 mg total) by mouth daily after supper. 05/04/23   Shane Steffan BROCKS, MD  TRELEGY ELLIPTA  100-62.5-25 MCG/ACT AEPB INHALE ONE PUFF BY MOUTH INTO THE LUNGS ONE TIME DAILY 02/17/23   Hunsucker, Donnice SAUNDERS, MD  venlafaxine XR (EFFEXOR-XR) 150 MG 24 hr capsule Take 150 mg by mouth daily. Patient takes 150mg  + 75mg  to make 225mg  04/20/23   [provider]  venlafaxine XR (EFFEXOR-XR) 75 MG 24 hr capsule Take 75 mg by mouth at bedtime. Patient takes  75mg  + 150mg  to make 225mg     [provider]    Allergies: Penicillins and Cephalexin    Review of Systems  Genitourinary:  Positive for flank pain.    Updated Vital Signs BP 122/62   Pulse 84   Temp 98.2 F (36.8 C) (Oral)   Resp 16   Ht 5' 3 (1.6 m)   Wt 90.7 kg   SpO2 96%   BMI 35.43 kg/m   Physical Exam Vitals and nursing note reviewed.  Constitutional:      Appearance: She is not toxic-appearing.   Eyes:     Pupils: Pupils are equal, round, and reactive to light.   Pulmonary:     Effort: Pulmonary effort is normal.  Abdominal:     General:  Abdomen is flat.   Skin:    General: Skin is warm.   Neurological:     General: No focal deficit present.     Mental Status: She is alert.     (all labs ordered are listed, but only abnormal results are displayed) Labs Reviewed  URINALYSIS, ROUTINE W REFLEX MICROSCOPIC - Abnormal; Notable for the following components:      Result Value   Leukocytes,Ua TRACE (*)    All other components within normal limits  BASIC METABOLIC PANEL WITH GFR - Abnormal; Notable for the following components:   Glucose, Bld 154 (*)    Creatinine, Ser 1.05 (*)    All other components within normal limits  CBC WITH DIFFERENTIAL/PLATELET - Abnormal; Notable for the following components:   WBC 12.5 (*)    Hemoglobin 11.9 (*)    Neutro Abs 10.2 (*)    All other components within normal limits    EKG: None  Radiology: CT Renal Stone Study Result Date: 06/27/2023 CLINICAL DATA:  Left flank pain beginning last night. Nausea. Nephrolithiasis. EXAM: CT ABDOMEN AND PELVIS WITHOUT CONTRAST TECHNIQUE: Multidetector CT imaging of the abdomen and pelvis was performed following the standard protocol without IV contrast. RADIATION DOSE REDUCTION: This exam was performed according to the departmental dose-optimization program which includes automated exposure control, adjustment of the mA and/or kV according to patient size and/or use of iterative reconstruction technique. COMPARISON:  06/12/2023 FINDINGS: Lower chest: No acute findings. Hepatobiliary: No mass visualized on this unenhanced exam. Mild diffuse hepatic steatosis again noted. Gallbladder is unremarkable. No evidence of biliary ductal dilatation. Pancreas: No mass or inflammatory process visualized on this unenhanced exam. Spleen:  Within normal limits in size. Adrenals/Urinary tract: Several tiny less than 5 mm renal calculi are again seen bilaterally. Previously seen 3 mm calculus at the right UPJ has now passed into the urinary bladder and right hydronephrosis  has resolved since prior study. Severe left hydroureteronephrosis is new since previous study, with 3 mm calculus in the mid to distal left ureter on image 58/2. Stomach/Bowel: No evidence of obstruction, inflammatory process, or abnormal fluid collections. Diverticulosis is seen mainly involving the descending and sigmoid colon, however there is no evidence of diverticulitis. Vascular/Lymphatic: No pathologically enlarged lymph nodes identified. No evidence of abdominal aortic aneurysm. Reproductive:  No mass or other significant abnormality. Other:  None. Musculoskeletal:  No suspicious bone lesions identified. IMPRESSION: Severe left hydroureteronephrosis due to 3 mm calculus in mid to distal left ureter. Previously seen right UPJ calculus has now passed into the urinary bladder, and right hydronephrosis has resolved since prior study. Bilateral nephrolithiasis. Colonic diverticulosis, without radiographic evidence of diverticulitis. Mild hepatic steatosis. Electronically Signed   By: Norleen LABOR  Graham M.D.   On: 06/27/2023 09:03     Procedures   Medications Ordered in the ED  HYDROmorphone  (DILAUDID ) injection 1 mg (1 mg Intravenous Given 06/27/23 0908)  ondansetron  (ZOFRAN ) injection 4 mg (4 mg Intravenous Given 06/27/23 0909)  ketorolac  (TORADOL ) 30 MG/ML injection 15 mg (15 mg Intravenous Given 06/27/23 0908)  sodium chloride  0.9 % bolus 1,000 mL (0 mLs Intravenous Stopped 06/27/23 1158)    Clinical Course as of 06/27/23 1201  Tue Jun 27, 2023  0915 CT Renal Bethena Rear [CG]    Clinical Course User Index [CG] Ruthell Lonni JULIANNA DEVONNA                                 Medical Decision Making 63 year old female here today for left-sided flank pain.  Plan- on exam, patient overall well appearing.  She has no systemic signs of infection.  No significant leukocytosis, renal function at baseline.  She has no infection in her urine.  Stone has moved, is 3 mm.  Was able to the patient symptoms under  control.  She feels okay to go home to try to pass the stone bounced continuously.  She has a follow-up with urology on Friday.  Will discharge.  Amount and/or Complexity of Data Reviewed Labs: ordered. Radiology: ordered.  Risk Prescription drug management.        Final diagnoses:  Left flank pain    ED Discharge Orders          Ordered    ketorolac  (TORADOL ) 10 MG tablet  Every 6 hours PRN        06/27/23 1141    oxyCODONE  (ROXICODONE ) 5 MG immediate release tablet  Every 4 hours PRN        06/27/23 1141    ondansetron  (ZOFRAN ) 4 MG tablet  Every 6 hours        06/27/23 1141               Mannie Pac T, DO 06/27/23 1203

## 2023-06-27 NOTE — ED Triage Notes (Addendum)
 Presents with L flank pain, started last night. Pt has long history of stones and has scan scheduled this Friday. Some reported nausea.  Pt took po Tramadol this morning PTA

## 2023-06-27 NOTE — Discharge Instructions (Addendum)
 You have a 3mm stone at the mid ureter on the left. Hopefully, this stone will pass. I have sent a prescription for pain medication to your pharmacy. You can take 5mg  of oxycodone  as needed for pain every 4 hours. You can take toradol  10mg  every 6 hours. Follow up with urology on Friday. Return to the ED for fever or worsening pain.

## 2023-07-13 ENCOUNTER — Other Ambulatory Visit: Payer: Self-pay | Admitting: Acute Care

## 2023-07-13 DIAGNOSIS — Z122 Encounter for screening for malignant neoplasm of respiratory organs: Secondary | ICD-10-CM

## 2023-07-13 DIAGNOSIS — Z87891 Personal history of nicotine dependence: Secondary | ICD-10-CM

## 2023-07-25 ENCOUNTER — Ambulatory Visit (HOSPITAL_BASED_OUTPATIENT_CLINIC_OR_DEPARTMENT_OTHER)
Admission: RE | Admit: 2023-07-25 | Discharge: 2023-07-25 | Disposition: A | Discharge status: Home / Self Care | Source: Ambulatory Visit | Attending: Acute Care | Admitting: Acute Care

## 2023-07-25 DIAGNOSIS — Z122 Encounter for screening for malignant neoplasm of respiratory organs: Secondary | ICD-10-CM | POA: Insufficient documentation

## 2023-07-25 DIAGNOSIS — Z87891 Personal history of nicotine dependence: Secondary | ICD-10-CM | POA: Diagnosis present

## 2023-07-31 ENCOUNTER — Other Ambulatory Visit: Payer: Self-pay

## 2023-07-31 DIAGNOSIS — Z122 Encounter for screening for malignant neoplasm of respiratory organs: Secondary | ICD-10-CM

## 2023-07-31 DIAGNOSIS — J209 Acute bronchitis, unspecified: Secondary | ICD-10-CM

## 2023-07-31 DIAGNOSIS — Z87891 Personal history of nicotine dependence: Secondary | ICD-10-CM

## 2023-10-03 ENCOUNTER — Other Ambulatory Visit: Payer: Self-pay | Admitting: Pulmonary Disease

## 2023-10-04 ENCOUNTER — Other Ambulatory Visit (HOSPITAL_COMMUNITY): Payer: Self-pay

## 2023-10-04 ENCOUNTER — Telehealth: Payer: Self-pay

## 2023-10-04 NOTE — Telephone Encounter (Signed)
*  Pulm  Pharmacy Patient Advocate Encounter   Received notification from CoverMyMeds that prior authorization for Trelegy Ellipta  is required/requested.   Insurance verification completed.   The patient is insured through [employer].   Per test claim: Refill too soon. PA is not needed at this time. Medication was filled 10/01. Next eligible fill date is 10/24.

## 2023-11-02 ENCOUNTER — Telehealth: Payer: Self-pay

## 2023-11-02 NOTE — Telephone Encounter (Signed)
 Spoke with patient schedule an over due OV - DME is requesting updated note to renew O2

## 2023-11-13 ENCOUNTER — Ambulatory Visit: Admitting: Pulmonary Disease

## 2023-11-13 ENCOUNTER — Encounter: Payer: Self-pay | Admitting: Pulmonary Disease

## 2023-11-13 VITALS — BP 130/50 | HR 78 | Temp 98.3°F | Ht 63.0 in | Wt 205.2 lb

## 2023-11-13 DIAGNOSIS — J9611 Chronic respiratory failure with hypoxia: Secondary | ICD-10-CM | POA: Diagnosis not present

## 2023-11-13 DIAGNOSIS — G4719 Other hypersomnia: Secondary | ICD-10-CM | POA: Diagnosis not present

## 2023-11-13 DIAGNOSIS — J432 Centrilobular emphysema: Secondary | ICD-10-CM | POA: Diagnosis not present

## 2023-11-13 NOTE — Progress Notes (Signed)
 @Patient  ID: Mckenzie Sullivan, female    DOB: 1960-11-22, 63 y.o.   MRN: [id number]  Chief Complaint  Patient presents with   Emphysema    Pt states SOB pretty often. Coughing up mucus every morning.    Referring provider: Barbra Odor, NP  HPI:   63 y.o. woman whom we are seeing in follow up for evaluation of COPD and chronic hypoxemic respiratory failure.  Most recent cardiology note reviewed.  Overall stable.  Think dry phlegm with appearance of flattened raspberries in the morning.  Discussed changing oxygen to simple facemask and unification if this helps.  Dyspnea largely stable but certainly a problem.  Requalified for oxygen today as below.  Discussed addition of medications for COPD and shortness of breath.  She reports good adherence to Trelegy.  She complains of increased fatigue, increased daytime sleepiness.  She had a sleep test in [place]  many years ago that showed nocturnal hypoxemia but no as OSA.  Given her new symptoms we discussed reasonable to pursue sleep study for further evaluation.  HPI at initial visit: Patient was in normal state of health in 2017.  She went for surgery for kidney stones.  When she was awakened from anesthesia she had hypoxemia.  She is placed on oxygen.  Has been on oxygen ever since.  Often with dyspnea started at that time.  Not really dyspneic prior to that.  Did not know had an issue with oxygen.  She uses 3 L with exertion.  This is been relatively stable over the last few years.  She quit smoking in 2010.  She has a approximately 40 pack year smoking history prior to that.  Reviewed her most recent PFTs in 2022 that demonstrates mild to moderate fixed obstruction, moderate reduced DLCO.  Reviewed most recent cross-sectional imaging CT scan 2017 that demonstrated severe emphysema per report.  Reviewed recent CT kidney stone scan that does show signs of emphysematous changes in the lower lobes albeit mild.  Reviewed most recent echocardiogram  02/2021 that does not demonstrate or discuss any right ventricular or right-sided abnormality.  Reviewed most recent echocardiogram in [place]  03/2019 that revealed normal RV size and function, normal RA size, normal RA pressure, no significant tricuspid regurgitation to suggest pulmonary hypertension.  She uses Trelegy and albuterol  as needed.  She thinks they help a little bit.  She is not sure albuterol  makes her feel any better, does give her the shakes.  She is currently enrolled in lung cancer screening with upcoming scan next week.  She denies any history of exacerbations of her underlying breathing or COPD requiring prednisone  or hospitalization.  PMH: COPD, kidney stones Surgical history: Multiple lithotripsy, ureteral stent Family history: Mother history of pneumonia, no significant rest or illness in first relatives Social history: Former smoker, 40-pack-year, quit in 2010, [place], moved to [place]  from [place]  in 2023 as husband got a new job at [employer] / Pulmonary Flowsheets:   ACT:      No data to display          MMRC:     No data to display          Epworth:      No data to display          Tests:   FENO:  No results found for: NITRICOXIDE  PFT:     No data to display          WALK:  No data to display          Imaging: Personally reviewed and as per EMR discussion this note No results found.  Lab Results: Personally reviewed CBC    Component Value Date/Time   WBC 12.5 (H) 06/27/2023 0815   RBC 4.50 06/27/2023 0815   HGB 11.9 (L) 06/27/2023 0815   HCT 39.1 06/27/2023 0815   PLT 321 06/27/2023 0815   MCV 86.9 06/27/2023 0815   MCH 26.4 06/27/2023 0815   MCHC 30.4 06/27/2023 0815   RDW 14.0 06/27/2023 0815   LYMPHSABS 1.2 06/27/2023 0815   MONOABS 0.6 06/27/2023 0815   EOSABS 0.2 06/27/2023 0815   BASOSABS 0.1 06/27/2023 0815    BMET    Component Value  Date/Time   NA 139 06/27/2023 0815   NA 142 05/20/2022 1301   K 3.6 06/27/2023 0815   CL 102 06/27/2023 0815   CO2 24 06/27/2023 0815   GLUCOSE 154 (H) 06/27/2023 0815   BUN 15 06/27/2023 0815   BUN 13 05/20/2022 1301   CREATININE 1.05 (H) 06/27/2023 0815   CALCIUM 9.2 06/27/2023 0815   GFRNONAA >60 06/27/2023 0815    BNP No results found for: BNP  ProBNP No results found for: PROBNP  Specialty Problems   None   Allergies  Allergen Reactions   Penicillins Itching   Cephalexin Other (See Comments)    Upset stomach    Immunization History  Administered Date(s) Administered   Influenza-Unspecified 11/05/2021   Moderna SARS-COV2 Booster Vaccination 09/11/2019   Pneumococcal Conjugate-13 03/14/2018   Pneumococcal Polysaccharide-23 11/25/2015    Past Medical History:  Diagnosis Date   Abdominal aortic atherosclerosis    CHF (congestive heart failure) (HCC)    Complication of anesthesia 2017   problems with O2 sats   COPD (chronic obstructive pulmonary disease) (HCC)    Depression    Diabetes mellitus without complication (HCC)    Dyslipidemia    History of kidney stones    Hypertension    Kidney stones    Oxygen dependent    Pneumonia     Tobacco History: Social History   Tobacco Use  Smoking Status Former   Current packs/day: 0.00   Types: Cigarettes   Start date: 58   Quit date: 2010   Years since quitting: 15.8  Smokeless Tobacco Never   Counseling given: Not Answered   Continue to not smoke  Outpatient Encounter Medications as of 11/13/2023  Medication Sig   aspirin EC 81 MG tablet Take 81 mg by mouth daily. Swallow whole.   estradiol (ESTRACE) 0.1 MG/GM vaginal cream Place vaginally.   halobetasol (ULTRAVATE) 0.05 % cream Apply topically.   levalbuterol  (XOPENEX  HFA) 45 MCG/ACT inhaler Inhale into the lungs every 4 (four) hours as needed for wheezing.   losartan  (COZAAR ) 25 MG tablet TAKE ONE TABLET BY MOUTH EVERY MORNING   OTEZLA  30 MG TABS Take 1 tablet by mouth 2 (two) times daily.   tamsulosin  (FLOMAX ) 0.4 MG CAPS capsule Take 1 capsule (0.4 mg total) by mouth daily after supper.   TRELEGY ELLIPTA  100-62.5-25 MCG/ACT AEPB INHALE ONE PUFF BY MOUTH ONE TIME DAILY   venlafaxine XR (EFFEXOR-XR) 150 MG 24 hr capsule Take 150 mg by mouth daily. Patient takes 150mg  + 75mg  to make 225mg    venlafaxine XR (EFFEXOR-XR) 75 MG 24 hr capsule Take 75 mg by mouth at bedtime. Patient takes 75mg  + 150mg  to make 225mg    [DISCONTINUED] atorvastatin (LIPITOR) 40 MG tablet Take 40 mg by  mouth daily.   [DISCONTINUED] HYDROcodone -acetaminophen  (NORCO/VICODIN) 5-325 MG tablet Take 1 tablet by mouth every 6 (six) hours as needed.   [DISCONTINUED] hyoscyamine  (ANASPAZ ) 0.125 MG TBDP disintergrating tablet Place 1 tablet (0.125 mg total) under the tongue every 6 (six) hours as needed for up to 20 doses.   [DISCONTINUED] hyoscyamine  (ANASPAZ ) 0.125 MG TBDP disintergrating tablet Place 1 tablet (0.125 mg total) under the tongue every 6 (six) hours as needed for up to 20 doses.   [DISCONTINUED] ketorolac  (TORADOL ) 10 MG tablet Take 1 tablet (10 mg total) by mouth every 6 (six) hours as needed.   [DISCONTINUED] ondansetron  (ZOFRAN ) 4 MG tablet Take 1 tablet (4 mg total) by mouth every 6 (six) hours.   [DISCONTINUED] oxyCODONE  (ROXICODONE ) 5 MG immediate release tablet Take 1 tablet (5 mg total) by mouth every 4 (four) hours as needed for severe pain (pain score 7-10).   [DISCONTINUED] phenazopyridine  (PYRIDIUM ) 200 MG tablet Take 1 tablet (200 mg total) by mouth 3 (three) times daily as needed for up to 6 doses.   [DISCONTINUED] tamsulosin  (FLOMAX ) 0.4 MG CAPS capsule Take 0.4 mg by mouth daily.   [DISCONTINUED] tamsulosin  (FLOMAX ) 0.4 MG CAPS capsule Take 1 capsule (0.4 mg total) by mouth daily after supper.   No facility-administered encounter medications on file as of 11/13/2023.     Review of Systems  Review of Systems  N/a Physical  Exam  BP (!) 130/50   Pulse 78   Temp 98.3 F (36.8 C) (Oral)   Ht 5' 3 (1.6 m) Comment: pet pt  Wt 205 lb 3.2 oz (93.1 kg)   SpO2 92% Comment: on 3lpm pulsed o2 with exertion  BMI 36.35 kg/m   Wt Readings from Last 5 Encounters:  11/13/23 205 lb 3.2 oz (93.1 kg)  06/27/23 200 lb (90.7 kg)  06/12/23 200 lb (90.7 kg)  05/04/23 199 lb (90.3 kg)  05/02/23 199 lb (90.3 kg)    BMI Readings from Last 5 Encounters:  11/13/23 36.35 kg/m  06/27/23 35.43 kg/m  06/12/23 35.43 kg/m  05/04/23 35.25 kg/m  05/02/23 35.25 kg/m     Physical Exam General: Sitting in chair in no acute distress Eyes: No icterus Neck: No JVP appreciated Pulmonary: Distant, clear on 3 L pulsed nasal cannula Cardiovascular: Warm Abdomen: Nondistended  Assessment & Plan:    COPD: Based on prior PFT result.  Significant dyspnea remains despite Trelegy.  Paperwork today for new start Ohtuvayre .  Chronic hypoxemic respiratory failure: In the setting of COPD, emphysema.  Oxygen saturation adequate on supplemental oxygen, 3 L pulsed today.  Ambulated and requalify for oxygen with results as below. Vitals:   11/13/23 1528 11/13/23 1553 11/13/23 1556 11/13/23 1557  BP: (!) 130/50     Pulse: 78     Temp: 98.3 F (36.8 C)     Height: 5' 3 (1.6 m) Comment: pet pt     Weight: 205 lb 3.2 oz (93.1 kg)     SpO2: 95% Comment: 3lpm pulsed o2 93% Comment: on RA at rest (!) 85% Comment: on RA with exertion 92% Comment: on 3lpm pulsed o2 with exertion  TempSrc: Oral     BMI (Calculated): 36.36       Pulmonary hypertension: Right heart catheterization in 2017 with mean PA 30, LVEDP 23, cardiac output 4.6, RA 16, PVR less than 2.  Likely primarily driven by group 2 disease given these findings.  Recommend adequate diuresis at the discretion of her cardiologist.  Possible group 3 disease  is contributing as well, no ILD to target with inhaled therapies.  Excessive daytime sleepiness: New order for sleep study  to assess this.  Split-night study given nocturnal hypoxemia at baseline.  Return in about 6 months (around 05/12/2024) for f/u Dr. Annella.   Donnice JONELLE Annella, MD 11/13/2023

## 2023-11-13 NOTE — Patient Instructions (Addendum)
 Nice to see you again  New order today for a simple facemask to wear at night as well as to see if we can add humidification to your home oxygen concentrator.  Paperwork today for the medication called Ohtuvayre , nebulized twice a day  Return to clinic in 6 months or sooner as needed with Dr. Annella

## 2023-11-16 ENCOUNTER — Telehealth: Payer: Self-pay

## 2023-11-16 NOTE — Telephone Encounter (Signed)
 Received Ohtuvayre  new start paperwork. Completed form and faxed with clinicals and insurance card copy to [facility]   Phone#: [phone] Fax#: [phone]

## 2023-11-16 NOTE — Telephone Encounter (Signed)
Paper work faxed to [employer]

## 2023-11-17 NOTE — Telephone Encounter (Signed)
 Received fax from [employer] with summary of benefits. Referral form for Ohtuvayre  received. Rx will be triaged to [facility].. Once benefits investigation completed, pharmacy will reach out the patient to schedule shipment. If medication is unaffordable, patient will need to express financial hardship to be referred back to [facility] for patient assistance program pre-screening.   Patient ID: [id number] Pharmacy phone: [phone] [facility] Phone#: [phone]

## 2023-11-17 NOTE — Telephone Encounter (Signed)
 Received fax from [employer] confirming receipt of enrollment form.  Patient ID: [id number]

## 2023-11-21 NOTE — Telephone Encounter (Signed)
 Received fax from [facility] stating a pa is needed for Ohtuvayre . Submitted a Prior Authorization request to [employer] for OHTUVAYRE  via CoverMyMeds. Will update once we receive a response.  Key: [facility]

## 2023-11-22 NOTE — Telephone Encounter (Signed)
 Received a fax regarding Prior Authorization from [employer] for OHTUVAYRE . Authorization has been DENIED because (1) Your doctor provides medical records (for example: chart notes) showing you have a specific lung test [post-bronchodilator forced expiratory volume (FEV1)/ forced vital capacity (FVC) ratio less than 0.70]. (2) Your doctor provides medical records (for example: chart notes) showing you still have symptoms even though you are on at least two therapies for the treatment of your condition (chronic obstructive pulmonary disease) and will continue to be treated with the therapies (for example: long-acting muscarinic antagonist such as tiotropium, long-acting beta-2 agonist such as formoterol), unless you cannot take them. (3) Your doctor provides medical records (for example: chart notes) showing you experience shortness of breath (dyspnea) during everyday activities (for example: short of breath when walking up a slight hill).SABRA   PFTs 04/09/2019 (previous pulmonologist); FVC 2.49 (76%), FEV1 1.97 (78%), TLC 3.80 (75%), VAS B3.54 (72%

## 2023-11-24 NOTE — Telephone Encounter (Signed)
 Faxed URGENT appeal to [employer] at [phone] for OHTUVAYRE .   Will await response.

## 2023-12-06 NOTE — Telephone Encounter (Signed)
 Received fax from [employer] stating they have been unable to reach pt for bridge program. Sent message to pt to have her call and get set up with bridge.

## 2024-01-09 ENCOUNTER — Other Ambulatory Visit (HOSPITAL_COMMUNITY): Payer: Self-pay

## 2024-01-09 NOTE — Telephone Encounter (Signed)
 Called [employer] Benefits Department for update on appeal. Was advised by rep that they did not receive the appeal letter. They requested I refax it, confirmed the original fax number it was sent to is correct. Refaxed appeal letter, will check status later this week.  Phone #: [phone] Fax #: [phone]

## 2024-01-19 ENCOUNTER — Ambulatory Visit (HOSPITAL_BASED_OUTPATIENT_CLINIC_OR_DEPARTMENT_OTHER): Admitting: Pulmonary Disease

## 2024-02-06 ENCOUNTER — Ambulatory Visit (HOSPITAL_BASED_OUTPATIENT_CLINIC_OR_DEPARTMENT_OTHER): Attending: Pulmonary Disease | Admitting: Pulmonary Disease

## 2024-02-06 DIAGNOSIS — G4719 Other hypersomnia: Secondary | ICD-10-CM | POA: Insufficient documentation

## 2024-02-07 ENCOUNTER — Emergency Department (HOSPITAL_COMMUNITY)
Admission: EM | Admit: 2024-02-07 | Discharge: 2024-02-08 | Disposition: A | Attending: Emergency Medicine | Admitting: Emergency Medicine

## 2024-02-07 ENCOUNTER — Emergency Department (HOSPITAL_COMMUNITY)

## 2024-02-07 DIAGNOSIS — Z7982 Long term (current) use of aspirin: Secondary | ICD-10-CM | POA: Insufficient documentation

## 2024-02-07 DIAGNOSIS — N201 Calculus of ureter: Secondary | ICD-10-CM

## 2024-02-07 DIAGNOSIS — I110 Hypertensive heart disease with heart failure: Secondary | ICD-10-CM | POA: Insufficient documentation

## 2024-02-07 DIAGNOSIS — J449 Chronic obstructive pulmonary disease, unspecified: Secondary | ICD-10-CM | POA: Insufficient documentation

## 2024-02-07 DIAGNOSIS — I509 Heart failure, unspecified: Secondary | ICD-10-CM | POA: Insufficient documentation

## 2024-02-07 DIAGNOSIS — Z79899 Other long term (current) drug therapy: Secondary | ICD-10-CM | POA: Insufficient documentation

## 2024-02-07 DIAGNOSIS — N132 Hydronephrosis with renal and ureteral calculous obstruction: Secondary | ICD-10-CM | POA: Insufficient documentation

## 2024-02-07 LAB — URINALYSIS, ROUTINE W REFLEX MICROSCOPIC
Bacteria, UA: NONE SEEN
Bilirubin Urine: NEGATIVE
Glucose, UA: NEGATIVE mg/dL
Hgb urine dipstick: NEGATIVE
Ketones, ur: NEGATIVE mg/dL
Nitrite: NEGATIVE
Protein, ur: NEGATIVE mg/dL
Specific Gravity, Urine: 1.019 (ref 1.005–1.030)
pH: 6 (ref 5.0–8.0)

## 2024-02-07 LAB — COMPREHENSIVE METABOLIC PANEL WITH GFR
ALT: 25 U/L (ref 0–44)
AST: 29 U/L (ref 15–41)
Albumin: 4.2 g/dL (ref 3.5–5.0)
Alkaline Phosphatase: 117 U/L (ref 38–126)
Anion gap: 12 (ref 5–15)
BUN: 15 mg/dL (ref 8–23)
CO2: 25 mmol/L (ref 22–32)
Calcium: 9.7 mg/dL (ref 8.9–10.3)
Chloride: 103 mmol/L (ref 98–111)
Creatinine, Ser: 1.07 mg/dL — ABNORMAL HIGH (ref 0.44–1.00)
GFR, Estimated: 59 mL/min — ABNORMAL LOW
Glucose, Bld: 123 mg/dL — ABNORMAL HIGH (ref 70–99)
Potassium: 4.3 mmol/L (ref 3.5–5.1)
Sodium: 140 mmol/L (ref 135–145)
Total Bilirubin: 0.4 mg/dL (ref 0.0–1.2)
Total Protein: 7.8 g/dL (ref 6.5–8.1)

## 2024-02-07 LAB — CBC WITH DIFFERENTIAL/PLATELET
Abs Immature Granulocytes: 0.07 10*3/uL (ref 0.00–0.07)
Basophils Absolute: 0.1 10*3/uL (ref 0.0–0.1)
Basophils Relative: 1 %
Eosinophils Absolute: 0.2 10*3/uL (ref 0.0–0.5)
Eosinophils Relative: 2 %
HCT: 39.2 % (ref 36.0–46.0)
Hemoglobin: 12.2 g/dL (ref 12.0–15.0)
Immature Granulocytes: 1 %
Lymphocytes Relative: 13 %
Lymphs Abs: 1.7 10*3/uL (ref 0.7–4.0)
MCH: 27.4 pg (ref 26.0–34.0)
MCHC: 31.1 g/dL (ref 30.0–36.0)
MCV: 87.9 fL (ref 80.0–100.0)
Monocytes Absolute: 0.8 10*3/uL (ref 0.1–1.0)
Monocytes Relative: 7 %
Neutro Abs: 9.7 10*3/uL — ABNORMAL HIGH (ref 1.7–7.7)
Neutrophils Relative %: 76 %
Platelets: 320 10*3/uL (ref 150–400)
RBC: 4.46 MIL/uL (ref 3.87–5.11)
RDW: 14.6 % (ref 11.5–15.5)
WBC: 12.6 10*3/uL — ABNORMAL HIGH (ref 4.0–10.5)
nRBC: 0 % (ref 0.0–0.2)

## 2024-02-07 LAB — LIPASE, BLOOD: Lipase: 46 U/L (ref 11–51)

## 2024-02-07 MED ORDER — OXYCODONE-ACETAMINOPHEN 5-325 MG PO TABS
1.0000 | ORAL_TABLET | Freq: Once | ORAL | Status: AC
  Administered 2024-02-07: 1 via ORAL
  Filled 2024-02-07: qty 1

## 2024-02-07 MED ORDER — KETOROLAC TROMETHAMINE 30 MG/ML IJ SOLN
30.0000 mg | Freq: Once | INTRAMUSCULAR | Status: AC
  Administered 2024-02-07: 30 mg via INTRAVENOUS
  Filled 2024-02-07: qty 1

## 2024-02-07 MED ORDER — HYDROMORPHONE HCL 1 MG/ML IJ SOLN
1.0000 mg | Freq: Once | INTRAMUSCULAR | Status: AC
  Administered 2024-02-07: 1 mg via INTRAVENOUS
  Filled 2024-02-07: qty 1

## 2024-02-07 MED ORDER — ONDANSETRON 4 MG PO TBDP
4.0000 mg | ORAL_TABLET | Freq: Once | ORAL | Status: AC
  Administered 2024-02-07: 4 mg via ORAL
  Filled 2024-02-07: qty 1

## 2024-02-07 NOTE — ED Provider Triage Note (Signed)
 Emergency Medicine Provider Triage Evaluation Note  Mckenzie Sullivan , a 64 y.o. female  was evaluated in triage.  Pt complains of flank pain.  Patient reportedly has had multiple kidney stones and reports current right-sided flank pain that started yesterday night feels the same as prior kidney stone episodes.  She endorses some nausea and some difficulty urinating but states that she has also had very little to eat or drink today due to her discomfort.  No fevers.  Denies any chest pain or shortness of breath.  No clear urinary symptoms such as dysuria, hematuria, or increased urinary frequency or urgency.  Review of Systems  Positive: As above Negative: As above  Physical Exam  BP (!) 143/105 (BP Location: Left Arm)   Pulse 78   Temp 98.3 F (36.8 C) (Oral)   Resp (!) 22   SpO2 94%  Gen:   Awake, no distress   Resp:  Normal effort  MSK:   Moves extremities without difficulty  Other:  Right-sided CVA tenderness present.  Medical Decision Making  Medically screening exam initiated at 6:43 PM.  Appropriate orders placed.  Mckenzie Sullivan was informed that the remainder of the evaluation will be completed by another provider, this initial triage assessment does not replace that evaluation, and the importance of remaining in the ED until their evaluation is complete.     Mckenzie Sullivan A, PA-C 02/07/24 1844

## 2024-02-07 NOTE — ED Provider Notes (Signed)
 " [place] EMERGENCY DEPARTMENT AT [facility] Provider Note   CSN: [id number] Arrival date & time: 02/07/24  1805     Patient presents with: Flank Pain   Mckenzie Sullivan is a 64 y.o. female.  {Add pertinent medical, surgical, social history, OB history to YEP:67052} The history is provided by the patient and medical records.  Flank Pain   64 year old female with history of kidney stones, hypertension, dyslipidemia, COPD, CHF, depression, presenting to the ED for right flank pain.  States she woke up with pain this morning but attributed this to sleeping on a hard bed as she had a sleep study at the hospital last night.  States throughout the day pain has been worsening with some radiation to her right groin.  She reports some nausea but denies any vomiting.  Has had decreased urine output today.  No noted hematuria.  No fever or chills.  She is well-established with [facility].  Prior to Admission medications  Medication Sig Start Date End Date Taking? Authorizing Provider  aspirin EC 81 MG tablet Take 81 mg by mouth daily. Swallow whole.    [provider]  estradiol (ESTRACE) 0.1 MG/GM vaginal cream Place vaginally. 11/18/21   [provider]  halobetasol (ULTRAVATE) 0.05 % cream Apply topically. 11/29/21   [provider]  levalbuterol  (XOPENEX  HFA) 45 MCG/ACT inhaler Inhale into the lungs every 4 (four) hours as needed for wheezing.    [provider]  losartan  (COZAAR ) 25 MG tablet TAKE ONE TABLET BY MOUTH EVERY MORNING 06/20/22   Tolia, Sunit, DO  OTEZLA 30 MG TABS Take 1 tablet by mouth 2 (two) times daily. 02/13/22   [provider]  tamsulosin  (FLOMAX ) 0.4 MG CAPS capsule Take 1 capsule (0.4 mg total) by mouth daily after supper. 04/24/23   Shane Steffan BROCKS, MD  TRELEGY ELLIPTA  100-62.5-25 MCG/ACT AEPB INHALE ONE PUFF BY MOUTH ONE TIME DAILY 10/04/23   Hunsucker, Donnice SAUNDERS, MD  venlafaxine XR (EFFEXOR-XR) 150 MG 24 hr  capsule Take 150 mg by mouth daily. Patient takes 150mg  + 75mg  to make 225mg  04/20/23   [provider]  venlafaxine XR (EFFEXOR-XR) 75 MG 24 hr capsule Take 75 mg by mouth at bedtime. Patient takes 75mg  + 150mg  to make 225mg     [provider]    Allergies: Penicillins and Cephalexin    Review of Systems  Genitourinary:  Positive for flank pain.  All other systems reviewed and are negative.   Updated Vital Signs BP (!) 143/105 (BP Location: Left Arm)   Pulse 78   Temp 98.3 F (36.8 C) (Oral)   Resp (!) 22   SpO2 94%   Physical Exam Vitals and nursing note reviewed.  Constitutional:      Appearance: She is well-developed.  HENT:     Head: Normocephalic and atraumatic.  Eyes:     Conjunctiva/sclera: Conjunctivae normal.     Pupils: Pupils are equal, round, and reactive to light.  Cardiovascular:     Rate and Rhythm: Normal rate and regular rhythm.     Heart sounds: Normal heart sounds.  Pulmonary:     Effort: Pulmonary effort is normal. No respiratory distress.     Breath sounds: Normal breath sounds. No rhonchi.  Abdominal:     General: Bowel sounds are normal.     Palpations: Abdomen is soft.  Musculoskeletal:        General: Normal range of motion.     Cervical back: Normal range of  motion.  Skin:    General: Skin is warm and dry.  Neurological:     Mental Status: She is alert and oriented to person, place, and time.     (all labs ordered are listed, but only abnormal results are displayed) Labs Reviewed  CBC WITH DIFFERENTIAL/PLATELET - Abnormal; Notable for the following components:      Result Value   WBC 12.6 (*)    Neutro Abs 9.7 (*)    All other components within normal limits  COMPREHENSIVE METABOLIC PANEL WITH GFR - Abnormal; Notable for the following components:   Glucose, Bld 123 (*)    Creatinine, Ser 1.07 (*)    GFR, Estimated 59 (*)    All other components within normal limits  LIPASE, BLOOD  URINALYSIS, ROUTINE W REFLEX  MICROSCOPIC    EKG: None  Radiology: CT Renal Stone Study Result Date: 02/07/2024 EXAM: CT ABDOMEN AND PELVIS WITHOUT CONTRAST 02/07/2024 07:27:28 PM TECHNIQUE: CT of the abdomen and pelvis was performed without the administration of intravenous contrast. Multiplanar reformatted images are provided for review. Automated exposure control, iterative reconstruction, and/or weight-based adjustment of the mA/kV was utilized to reduce the radiation dose to as low as reasonably achievable. COMPARISON: CT abdomen and pelvis 07/24/2023. CLINICAL HISTORY: Abdominal/flank pain, stone suspected. FINDINGS: LOWER CHEST: No acute abnormality. LIVER: The liver is mildly enlarged. There is fatty infiltration of the liver. GALLBLADDER AND BILE DUCTS: Gallbladder is unremarkable. No biliary ductal dilatation. SPLEEN: No acute abnormality. PANCREAS: No acute abnormality. ADRENAL GLANDS: No acute abnormality. KIDNEYS, URETERS AND BLADDER: There is a 5 mm calculus in the proximal right ureter with moderate right sided hydronephrosis. There is mild perinephric fat stranding. There are 2 punctate calculi in the left kidney. No hydroureter on the left. Urinary bladder is unremarkable. GI AND BOWEL: Stomach demonstrates no acute abnormality. The appendix appears normal. There is sigmoid colon diverticulosis. There is no bowel obstruction. PERITONEUM AND RETROPERITONEUM: No ascites. No free air. VASCULATURE: Aorta is normal in caliber. There is chronic calcifications of the aorta. LYMPH NODES: No lymphadenopathy. REPRODUCTIVE ORGANS: No acute abnormality. BONES AND SOFT TISSUES: Breast implants are present. There is a small fat containing umbilical hernia. No acute osseous abnormality. IMPRESSION: 1. 5 mm calculus in the proximal right ureter with moderate right-sided hydronephrosis. 2. 2 punctate calculi in the left kidney. 3. Mild hepatomegaly with hepatic steatosis. 4. Sigmoid colon diverticulosis without evidence of diverticulitis.  Electronically signed by: Greig Pique MD 02/07/2024 07:37 PM EST RP Workstation: [id number]    {Document cardiac monitor, telemetry assessment procedure when appropriate:32947} Procedures   Medications Ordered in the ED  HYDROmorphone  (DILAUDID ) injection 1 mg (has no administration in time range)  ketorolac  (TORADOL ) 30 MG/ML injection 30 mg (has no administration in time range)  ondansetron  (ZOFRAN -ODT) disintegrating tablet 4 mg (4 mg Oral Given 02/07/24 1900)  oxyCODONE -acetaminophen  (PERCOCET/ROXICET) 5-325 MG per tablet 1 tablet (1 tablet Oral Given 02/07/24 2218)      {Click here for ABCD2, HEART and other calculators REFRESH Note before signing:1}                              Medical Decision Making Risk Prescription drug management.   ***  {Document critical care time when appropriate  Document review of labs and clinical decision tools ie CHADS2VASC2, etc  Document your independent review of radiology images and any outside records  Document your discussion with family members, caretakers and with consultants  Document social determinants of health affecting pt's care  Document your decision making why or why not admission, treatments were needed:32947:::1}   Final diagnoses:  None    ED Discharge Orders     None        "

## 2024-02-07 NOTE — ED Triage Notes (Signed)
 Patient reports hx of kidney stones, c/o right flank pain, nausea, urinary retention. Patient is alert and oriented x 4. Airway patent, respirations even and unlabored. Skin normal, warm and dry. Denies fevers.

## 2024-02-08 MED ORDER — TAMSULOSIN HCL 0.4 MG PO CAPS: 0.4000 mg | ORAL_CAPSULE | Freq: Every day | ORAL | 0 refills | Status: AC

## 2024-02-08 MED ORDER — TAMSULOSIN HCL 0.4 MG PO CAPS
0.4000 mg | ORAL_CAPSULE | Freq: Once | ORAL | Status: AC
  Administered 2024-02-08: 0.4 mg via ORAL
  Filled 2024-02-08: qty 1

## 2024-02-08 NOTE — Discharge Instructions (Addendum)
 Take the prescribed medication as directed. Touch base with urology in the morning to arrange close outpatient follow-up. Return to the ED for new or worsening symptoms.
